# Patient Record
Sex: Male | Born: 1980 | Race: White | Hispanic: No | Marital: Married | Smoking: Never smoker
Health system: Southern US, Community
[De-identification: ages and names within clinical notes are randomized; demographics above are authoritative.]

## PROBLEM LIST (undated history)

## (undated) DIAGNOSIS — E079 Disorder of thyroid, unspecified: Secondary | ICD-10-CM

## (undated) HISTORY — PX: THYROIDECTOMY: SHX17

---

## 2016-03-28 DIAGNOSIS — M5416 Radiculopathy, lumbar region: Secondary | ICD-10-CM | POA: Insufficient documentation

## 2017-12-03 ENCOUNTER — Emergency Department: Payer: BC Managed Care – PPO

## 2017-12-03 ENCOUNTER — Other Ambulatory Visit: Payer: Self-pay

## 2017-12-03 ENCOUNTER — Encounter: Payer: Self-pay | Admitting: Emergency Medicine

## 2017-12-03 ENCOUNTER — Emergency Department
Admission: EM | Admit: 2017-12-03 | Discharge: 2017-12-03 | Disposition: A | Discharge status: Home / Self Care | Payer: BC Managed Care – PPO | Attending: Emergency Medicine | Admitting: Emergency Medicine

## 2017-12-03 DIAGNOSIS — M5126 Other intervertebral disc displacement, lumbar region: Secondary | ICD-10-CM | POA: Diagnosis not present

## 2017-12-03 DIAGNOSIS — M549 Dorsalgia, unspecified: Secondary | ICD-10-CM | POA: Diagnosis present

## 2017-12-03 DIAGNOSIS — M5127 Other intervertebral disc displacement, lumbosacral region: Secondary | ICD-10-CM

## 2017-12-03 DIAGNOSIS — M5416 Radiculopathy, lumbar region: Secondary | ICD-10-CM

## 2017-12-03 HISTORY — DX: Disorder of thyroid, unspecified: E07.9

## 2017-12-03 MED ORDER — METHYLPREDNISOLONE 4 MG PO TBPK: ORAL_TABLET | ORAL | 0 refills | Status: DC

## 2017-12-03 MED ORDER — METHOCARBAMOL 750 MG PO TABS: 750.0000 mg | ORAL_TABLET | Freq: Four times a day (QID) | ORAL | 0 refills | Status: AC

## 2017-12-03 MED ORDER — LIDOCAINE 5 % EX PTCH
1.0000 | MEDICATED_PATCH | CUTANEOUS | Status: DC
  Administered 2017-12-03: 1 via TRANSDERMAL
  Filled 2017-12-03 (×2): qty 1

## 2017-12-03 MED ORDER — HYDROMORPHONE HCL 1 MG/ML IJ SOLN
1.0000 mg | Freq: Once | INTRAMUSCULAR | Status: AC
  Administered 2017-12-03: 1 mg via INTRAMUSCULAR
  Filled 2017-12-03: qty 1

## 2017-12-03 MED ORDER — OXYCODONE-ACETAMINOPHEN 7.5-325 MG PO TABS: 1.0000 | ORAL_TABLET | Freq: Four times a day (QID) | ORAL | 0 refills | Status: DC | PRN

## 2017-12-03 MED ORDER — METHYLPREDNISOLONE SODIUM SUCC 125 MG IJ SOLR
80.0000 mg | Freq: Once | INTRAMUSCULAR | Status: AC
  Administered 2017-12-03: 80 mg via INTRAVENOUS
  Filled 2017-12-03: qty 2

## 2017-12-03 MED ORDER — HYDROMORPHONE HCL 1 MG/ML IJ SOLN
1.0000 mg | Freq: Once | INTRAMUSCULAR | Status: AC
  Administered 2017-12-03: 1 mg via INTRAVENOUS
  Filled 2017-12-03: qty 1

## 2017-12-03 NOTE — ED Triage Notes (Signed)
Here for lower back pain. Possible herniated disc . Saw ortho this AM and got flexeril but no help.  Working out and thinks hurt few days ago.  Only thing that has helped in past was injection.  No loss bowel or bladder.

## 2017-12-03 NOTE — ED Provider Notes (Addendum)
[facility] Emergency Department Provider Note   ____________________________________________   First MD Initiated Contact with Patient 12/03/17 1740     (approximate)  I have reviewed the triage vital signs and the nursing notes.   HISTORY  Chief Complaint Back Pain    HPI Joel Atkinson is a 37 y.o. male patient sent from orthopedic clinic secondary to acute back pain and x-ray finding of suspected herniated disc.  Patient stated back pain began 3 days ago after performing heavy lifting for physical training.  Patient denies bladder bowel dysfunction.  Patient has radicular component to the right lower extremity.  Past Medical History:  Diagnosis Date  . Thyroid disease     There are no active problems to display for this patient.   Past Surgical History:  Procedure Laterality Date  . THYROIDECTOMY      Prior to Admission medications   Medication Sig Start Date End Date Taking? Authorizing Provider  cyclobenzaprine (FLEXERIL) 10 MG tablet Take 10 mg by mouth 3 (three) times daily as needed for muscle spasms.   Yes [provider]    Allergies Patient has no known allergies.  History reviewed. No pertinent family history.  Social History Social History   Tobacco Use  . Smoking status: Never Smoker  . Smokeless tobacco: Never Used  Substance Use Topics  . Alcohol use: Never    Frequency: Never  . Drug use: Never    Review of Systems Constitutional: No fever/chills Eyes: No visual changes. ENT: No sore throat. Cardiovascular: Denies chest pain. Respiratory: Denies shortness of breath. Gastrointestinal: No abdominal pain.  No nausea, no vomiting.  No diarrhea.  No constipation. Genitourinary: Negative for dysuria. Musculoskeletal: Acute low back pain Skin: Negative for rash. Neurological: Negative for headaches, focal weakness or numbness.   ____________________________________________   PHYSICAL EXAM:  VITAL  SIGNS: ED Triage Vitals  Enc Vitals Group     BP 12/03/17 1705 137/62     Pulse Rate 12/03/17 1705 (!) 49     Resp 12/03/17 1705 20     Temp 12/03/17 1705 97.8 F (36.6 C)     Temp Source 12/03/17 1705 Oral     SpO2 12/03/17 1705 100 %     Weight 12/03/17 1704 205 lb (93 kg)     Height 12/03/17 1704 6\' 5"  (1.956 m)     Head Circumference --      Peak Flow --      Pain Score 12/03/17 1704 10     Pain Loc --      Pain Edu? --      Excl. in GC? --    Constitutional: Alert and oriented. Well appearing and in no acute distress. Eyes: Conjunctivae are normal. PERRL. EOMI. Head: Atraumatic. Nose: No congestion/rhinnorhea. Mouth/Throat: Mucous membranes are moist.  Oropharynx non-erythematous. Neck: No stridor.  Hematological/Lymphatic/Immunilogical: No cervical lymphadenopathy. Cardiovascular: Asymptomatic bradycardia, regular rhythm. Grossly normal heart sounds.  Good peripheral circulation. Respiratory: Normal respiratory effort.  No retractions. Lungs CTAB. Gastrointestinal: Soft and nontender. No distention. No abdominal bruits. No CVA tenderness. Musculoskeletal: No obvious spinal deformity.  Patient has decreased range of motion with extension.. Neurologic:  Normal speech and language. No gross focal neurologic deficits are appreciated. No gait instability. Skin:  Skin is warm, dry and intact. No rash noted. Psychiatric: Mood and affect are normal. Speech and behavior are normal.  ____________________________________________   LABS (all labs ordered are listed, but only abnormal results are displayed)  Labs Reviewed -  No data to display ____________________________________________  EKG   ____________________________________________  RADIOLOGY  ED MD interpretation:    Official radiology report(s): No results found.  ____________________________________________   PROCEDURES  Procedure(s) performed: None  Procedures  Critical Care performed:  No  ____________________________________________   INITIAL IMPRESSION / ASSESSMENT AND PLAN / ED COURSE  As part of my medical decision making, I reviewed the following data within the electronic MEDICAL RECORD NUMBER    Patient sent for orthopedic department of acute back pain with x-ray suggesting herniated disc at L5-S1.  Patient MRI was consistent with disc protrusions at L3-L4 and L5-S1.  Protrusions were contacting and displacing accident nerve roots at L3, L5-S1.  Discussed MRI findings with patient.  Patient given discharge care instructions.  Patient advised to contact neurology in the morning for an appointment.  Take medication as directed.     ____________________________________________   FINAL CLINICAL IMPRESSION(S) / ED DIAGNOSES  Final diagnoses:  None     ED Discharge Orders    None       Note:  This document was prepared using Dragon voice recognition software and may include unintentional dictation errors.    Joni ReiningSmith, Ronald K, PA-C 12/03/17 1933    Joni ReiningSmith, Ronald K, PA-C 12/03/17 1936    Minna AntisPaduchowski, Kevin, MD 12/04/17 1517

## 2017-12-03 NOTE — ED Notes (Signed)
See triage note  States he has a herniated disc   Developed increased pain while working out  [facility]  Was given flex[facility]  No relief  Also has his back adjusted   States pain has increased

## 2017-12-04 ENCOUNTER — Ambulatory Visit
Admission: RE | Admit: 2017-12-04 | Discharge: 2017-12-04 | Disposition: A | Discharge status: Home / Self Care | Payer: BC Managed Care – PPO | Source: Ambulatory Visit | Attending: Pain Medicine | Admitting: Pain Medicine

## 2017-12-04 ENCOUNTER — Encounter: Payer: Self-pay | Admitting: Pain Medicine

## 2017-12-04 ENCOUNTER — Ambulatory Visit (HOSPITAL_BASED_OUTPATIENT_CLINIC_OR_DEPARTMENT_OTHER): Payer: BC Managed Care – PPO | Admitting: Pain Medicine

## 2017-12-04 VITALS — BP 131/63 | HR 64 | Temp 98.3°F | Resp 18 | Ht 77.0 in | Wt 205.0 lb

## 2017-12-04 DIAGNOSIS — M5441 Lumbago with sciatica, right side: Secondary | ICD-10-CM | POA: Diagnosis not present

## 2017-12-04 DIAGNOSIS — E039 Hypothyroidism, unspecified: Secondary | ICD-10-CM | POA: Insufficient documentation

## 2017-12-04 DIAGNOSIS — M5417 Radiculopathy, lumbosacral region: Secondary | ICD-10-CM | POA: Insufficient documentation

## 2017-12-04 DIAGNOSIS — Z789 Other specified health status: Secondary | ICD-10-CM

## 2017-12-04 DIAGNOSIS — M48061 Spinal stenosis, lumbar region without neurogenic claudication: Secondary | ICD-10-CM | POA: Insufficient documentation

## 2017-12-04 DIAGNOSIS — M47816 Spondylosis without myelopathy or radiculopathy, lumbar region: Secondary | ICD-10-CM | POA: Insufficient documentation

## 2017-12-04 DIAGNOSIS — M5136 Other intervertebral disc degeneration, lumbar region: Secondary | ICD-10-CM

## 2017-12-04 DIAGNOSIS — M51379 Other intervertebral disc degeneration, lumbosacral region without mention of lumbar back pain or lower extremity pain: Secondary | ICD-10-CM | POA: Insufficient documentation

## 2017-12-04 DIAGNOSIS — Z7952 Long term (current) use of systemic steroids: Secondary | ICD-10-CM | POA: Insufficient documentation

## 2017-12-04 DIAGNOSIS — Z79899 Other long term (current) drug therapy: Secondary | ICD-10-CM

## 2017-12-04 DIAGNOSIS — Z79891 Long term (current) use of opiate analgesic: Secondary | ICD-10-CM | POA: Insufficient documentation

## 2017-12-04 DIAGNOSIS — M51369 Other intervertebral disc degeneration, lumbar region without mention of lumbar back pain or lower extremity pain: Secondary | ICD-10-CM | POA: Insufficient documentation

## 2017-12-04 DIAGNOSIS — M5116 Intervertebral disc disorders with radiculopathy, lumbar region: Secondary | ICD-10-CM | POA: Insufficient documentation

## 2017-12-04 DIAGNOSIS — F1190 Opioid use, unspecified, uncomplicated: Secondary | ICD-10-CM | POA: Diagnosis not present

## 2017-12-04 DIAGNOSIS — M5117 Intervertebral disc disorders with radiculopathy, lumbosacral region: Secondary | ICD-10-CM | POA: Diagnosis not present

## 2017-12-04 DIAGNOSIS — M9983 Other biomechanical lesions of lumbar region: Secondary | ICD-10-CM

## 2017-12-04 DIAGNOSIS — M5416 Radiculopathy, lumbar region: Secondary | ICD-10-CM | POA: Diagnosis not present

## 2017-12-04 DIAGNOSIS — M7918 Myalgia, other site: Secondary | ICD-10-CM | POA: Diagnosis not present

## 2017-12-04 DIAGNOSIS — N281 Cyst of kidney, acquired: Secondary | ICD-10-CM

## 2017-12-04 DIAGNOSIS — R937 Abnormal findings on diagnostic imaging of other parts of musculoskeletal system: Secondary | ICD-10-CM

## 2017-12-04 DIAGNOSIS — M4310 Spondylolisthesis, site unspecified: Secondary | ICD-10-CM

## 2017-12-04 DIAGNOSIS — M5137 Other intervertebral disc degeneration, lumbosacral region: Secondary | ICD-10-CM

## 2017-12-04 DIAGNOSIS — M79604 Pain in right leg: Secondary | ICD-10-CM | POA: Insufficient documentation

## 2017-12-04 DIAGNOSIS — M5126 Other intervertebral disc displacement, lumbar region: Secondary | ICD-10-CM | POA: Insufficient documentation

## 2017-12-04 DIAGNOSIS — M792 Neuralgia and neuritis, unspecified: Secondary | ICD-10-CM | POA: Insufficient documentation

## 2017-12-04 DIAGNOSIS — M899 Disorder of bone, unspecified: Secondary | ICD-10-CM

## 2017-12-04 DIAGNOSIS — M5127 Other intervertebral disc displacement, lumbosacral region: Secondary | ICD-10-CM

## 2017-12-04 MED ORDER — MIDAZOLAM HCL 5 MG/5ML IJ SOLN
1.0000 mg | INTRAMUSCULAR | Status: AC | PRN
  Administered 2017-12-04: 5 mg via INTRAVENOUS
  Filled 2017-12-04: qty 5

## 2017-12-04 MED ORDER — KETOROLAC TROMETHAMINE 60 MG/2ML IM SOLN
INTRAMUSCULAR | Status: AC
  Filled 2017-12-04: qty 2

## 2017-12-04 MED ORDER — IOPAMIDOL (ISOVUE-M 200) INJECTION 41%
10.0000 mL | Freq: Once | INTRAMUSCULAR | Status: AC
  Administered 2017-12-04: 10 mL via EPIDURAL
  Filled 2017-12-04: qty 10

## 2017-12-04 MED ORDER — SODIUM CHLORIDE 0.9% FLUSH
2.0000 mL | Freq: Once | INTRAVENOUS | Status: AC
  Administered 2017-12-04: 2 mL

## 2017-12-04 MED ORDER — LACTATED RINGERS IV SOLN: 1000.0000 mL | Freq: Once | INTRAVENOUS | Status: DC

## 2017-12-04 MED ORDER — FENTANYL CITRATE (PF) 100 MCG/2ML IJ SOLN
25.0000 ug | INTRAMUSCULAR | Status: AC | PRN
  Administered 2017-12-04: 100 ug via INTRAVENOUS
  Filled 2017-12-04: qty 2

## 2017-12-04 MED ORDER — KETOROLAC TROMETHAMINE 60 MG/2ML IM SOLN
60.0000 mg | Freq: Once | INTRAMUSCULAR | Status: AC
  Administered 2017-12-04: 60 mg via INTRAMUSCULAR

## 2017-12-04 MED ORDER — ORPHENADRINE CITRATE 30 MG/ML IJ SOLN
60.0000 mg | Freq: Once | INTRAMUSCULAR | Status: AC
  Administered 2017-12-04: 60 mg via INTRAMUSCULAR

## 2017-12-04 MED ORDER — ORPHENADRINE CITRATE 30 MG/ML IJ SOLN
INTRAMUSCULAR | Status: AC
  Filled 2017-12-04: qty 2

## 2017-12-04 MED ORDER — TRIAMCINOLONE ACETONIDE 40 MG/ML IJ SUSP
40.0000 mg | Freq: Once | INTRAMUSCULAR | Status: AC
  Administered 2017-12-04: 40 mg
  Filled 2017-12-04: qty 1

## 2017-12-04 MED ORDER — ROPIVACAINE HCL 2 MG/ML IJ SOLN
2.0000 mL | Freq: Once | INTRAMUSCULAR | Status: AC
  Administered 2017-12-04: 2 mL via EPIDURAL
  Filled 2017-12-04: qty 10

## 2017-12-04 MED ORDER — LIDOCAINE HCL 2 % IJ SOLN
20.0000 mL | Freq: Once | INTRAMUSCULAR | Status: AC
  Administered 2017-12-04: 400 mg
  Filled 2017-12-04: qty 100

## 2017-12-04 NOTE — Patient Instructions (Addendum)
____________________________________________________________________________________________  Post-Procedure Discharge Instructions  Instructions:  Apply ice: Fill a plastic sandwich bag with crushed ice. Cover it with a small towel and apply to injection site. Apply for 15 minutes then remove x 15 minutes. Repeat sequence on day of procedure, until you go to bed. The purpose is to minimize swelling and discomfort after procedure.  Apply heat: Apply heat to procedure site starting the day following the procedure. The purpose is to treat any soreness and discomfort from the procedure.  Food intake: Start with clear liquids (like water) and advance to regular food, as tolerated.   Physical activities: Keep activities to a minimum for the first 8 hours after the procedure.   Driving: If you have received any sedation, you are not allowed to drive for 24 hours after your procedure.  Blood thinner: Restart your blood thinner 6 hours after your procedure. (Only for those taking blood thinners)  Insulin: As soon as you can eat, you may resume your normal dosing schedule. (Only for those taking insulin)  Infection prevention: Keep procedure site clean and dry.  Post-procedure Pain Diary: Extremely important that this be done correctly and accurately. Recorded information will be used to determine the next step in treatment.  Pain evaluated is that of treated area only. Do not include pain from an untreated area.  Complete every hour, on the hour, for the initial 8 hours. Set an alarm to help you do this part accurately.  Do not go to sleep and have it completed later. It will not be accurate.  Follow-up appointment: Keep your follow-up appointment after the procedure. Usually 2 weeks for most procedures. (6 weeks in the case of radiofrequency.) Bring you pain diary.   Expect:  From numbing medicine (AKA: Local Anesthetics): Numbness or decrease in pain.  Onset: Full effect within 15  minutes of injected.  Duration: It will depend on the type of local anesthetic used. On the average, 1 to 8 hours.   From steroids: Decrease in swelling or inflammation. Once inflammation is improved, relief of the pain will follow.  Onset of benefits: Depends on the amount of swelling present. The more swelling, the longer it will take for the benefits to be seen. In some cases, up to 10 days.  Duration: Steroids will stay in the system x 2 weeks. Duration of benefits will depend on multiple posibilities including persistent irritating factors.  From procedure: Some discomfort is to be expected once the numbing medicine wears off. This should be minimal if ice and heat are applied as instructed.  Call if:  You experience numbness and weakness that gets worse with time, as opposed to wearing off.  New onset bowel or bladder incontinence. (This applies to Spinal procedures only)  Emergency Numbers:  Durning business hours (Monday - Thursday, 8:00 AM - 4:00 PM) (Friday, 9:00 AM - 12:00 Noon): [phone]  After hours: [phone] ____________________________________________________________________________________________   Epidural Steroid Injection Patient Information  Description: The epidural space surrounds the nerves as they exit the spinal cord.  In some patients, the nerves can be compressed and inflamed by a bulging disc or a tight spinal canal (spinal stenosis).  By injecting steroids into the epidural space, we can bring irritated nerves into direct contact with a potentially helpful medication.  These steroids act directly on the irritated nerves and can reduce swelling and inflammation which often leads to decreased pain.  Epidural steroids may be injected anywhere along the spine and from the neck to the   low back depending upon the location of your pain.   After numbing the skin with local anesthetic (like Novocaine), a small needle is passed into the epidural space  slowly.  You may experience a sensation of pressure while this is being done.  The entire block usually last less than 10 minutes.  Conditions which may be treated by epidural steroids:   Low back and leg pain  Neck and arm pain  Spinal stenosis  Post-laminectomy syndrome  Herpes zoster (shingles) pain  Pain from compression fractures  Preparation for the injection:  1. Do not eat any solid food or dairy products within 8 hours of your appointment.  2. You may drink clear liquids up to 3 hours before appointment.  Clear liquids include water, black coffee, juice or soda.  No milk or cream please. 3. You may take your regular medication, including pain medications, with a sip of water before your appointment  Diabetics should hold regular insulin (if taken separately) and take 1/2 normal NPH dos the morning of the procedure.  Carry some sugar containing items with you to your appointment. 4. A driver must accompany you and be prepared to drive you home after your procedure.  5. Bring all your current medications with your. 6. An IV may be inserted and sedation may be given at the discretion of the physician.   7. A blood pressure cuff, EKG and other monitors will often be applied during the procedure.  Some patients may need to have extra oxygen administered for a short period. 8. You will be asked to provide medical information, including your allergies, prior to the procedure.  We must know immediately if you are taking blood thinners (like Coumadin/Warfarin)  Or if you are allergic to IV iodine contrast (dye). We must know if you could possible be pregnant.  Possible side-effects:  Bleeding from needle site  Infection (rare, may require surgery)  Nerve injury (rare)  Numbness & tingling (temporary)  Difficulty urinating (rare, temporary)  Spinal headache ( a headache worse with upright posture)  Light -headedness (temporary)  Pain at injection site (several  days)  Decreased blood pressure (temporary)  Weakness in arm/leg (temporary)  Pressure sensation in back/neck (temporary)  Call if you experience:  Fever/chills associated with headache or increased back/neck pain.  Headache worsened by an upright position.  New onset weakness or numbness of an extremity below the injection site  Hives or difficulty breathing (go to the emergency room)  Inflammation or drainage at the infection site  Severe back/neck pain  Any new symptoms which are concerning to you  Please note:  Although the local anesthetic injected can often make your back or neck feel good for several hours after the injection, the pain will likely return.  It takes 3-7 days for steroids to work in the epidural space.  You may not notice any pain relief for at least that one week.  If effective, we will often do a series of three injections spaced 3-6 weeks apart to maximally decrease your pain.  After the initial series, we generally will wait several months before considering a repeat injection of the same type.  If you have any questions, please call [phone] [facility]  General Discharge Instructions :  If you need to reach your doctor call: Monday-Friday 8:00 am - 4:00 pm at [phone] or toll free [phone].  After clinic hours [phone] to have operator reach doctor.  Bring all   your medication bottles to all your appointments in the pain clinic.  To cancel or reschedule your appointment with Pain Management please remember to call 24 hours in advance to avoid a fee.  Refer to the educational materials which you have been given on: General Risks, I had my Procedure. Discharge Instructions, Post Sedation.  Post Procedure Instructions:  The drugs you were given will stay in your system until tomorrow, so for the next 24 hours you should not drive, make any legal decisions  or drink any alcoholic beverages.  You may eat anything you prefer, but it is better to start with liquids then soups and crackers, and gradually work up to solid foods.  Please notify your doctor immediately if you have any unusual bleeding, trouble breathing or pain that is not related to your normal pain.  Depending on the type of procedure that was done, some parts of your body may feel week and/or numb.  This usually clears up by tonight or the next day.  Walk with the use of an assistive device or accompanied by an adult for the 24 hours.  You may use ice on the affected area for the first 24 hours.  Put ice in a Ziploc bag and cover with a towel and place against area 15 minutes on 15 minutes off.  You may switch to heat after 24 hours.

## 2017-12-04 NOTE — Progress Notes (Signed)
Patient's Name: Joel Atkinson  MRN: [id number]  Referring Provider: Sable Feil, PA-C  DOB: 04-16-1981  PCP: Tora Duck, MD  DOS: 12/04/2017  Note by: Gaspar Cola, MD  Service setting: Ambulatory outpatient  Specialty: Interventional Pain Management  Location: [facility] (AMB) Pain Management Facility    Patient type: New patient ("FAST-TRACK" Evaluation)   Warning: This referral option does not include the extensive pharmacological evaluation required for [place] to take over the patient's medication management. The "Fast-Track" system is designed to bypass the new patient referral waiting list, as well as the normal patient evaluation process, in order to provide a patient in distress with a timely pain management intervention. Because the system was not designed to unfairly get a patient into our pain practice ahead of those already waiting, certain restrictions apply. By requesting a "Fast-Track" consult, the referring physician has opted to continue managing the patient's medications in order to get interventional urgent care.  Primary Reason for Visit: Interventional Pain Management Treatment. CC: Back Pain (down the right leg to the top of the foot.)  Procedure:       Anesthesia, Analgesia, Anxiolysis:  Type: Diagnostic Inter-Laminar Epidural Steroid Injection #1  Region: Lumbar Level: L5-S1 Level. Laterality: Right-Sided Paramedial  Type: Moderate (Conscious) Sedation combined with Local Anesthesia Indication(s): Analgesia and Anxiety Route: Intravenous (IV) IV Access: Secured Sedation: Meaningful verbal contact was maintained at all times during the procedure  Local Anesthetic: Lidocaine 1-2%   Indications: 1. Acute right lumbar radiculopathy   2. Lumbosacral radiculopathy at S1   3. Lumbosacral radiculopathy at L5   4. Lumbar radicular pain   5. Acute right-sided low back pain with right-sided sciatica   6. Right lumbar radiculitis   7. Acute pain of right lower extremity    8. DDD (degenerative disc disease), lumbar   9. DDD (degenerative disc disease), lumbosacral    Pain Score: Pre-procedure: 9 /10 Post-procedure: 3 /10  HPI  Mr. Mangel is a 37 y.o. year old, male patient, who comes today for a  "Fast-Track" new patient evaluation, as requested by Sable Feil, PA-C. The patient has been made aware that this type of referral option is reserved for the Interventional Pain Management portion of our practice and completely excludes the option of medication management. His primarily concern today is the Back Pain (down the right leg to the top of the foot.)  Pain Assessment: Location: Lower Back Onset: In the past 7 days Duration: Acute pain Quality: Constant, Burning, Numbness, Stabbing, Pressure, Shooting, Radiating Severity: 9 /10 (self-reported pain score)  Note: Reported level is compatible with observation.                               Effect on ADL: As of now, the patient isnt really able to walk. Timing: Constant Modifying factors: nothing  Onset and Duration: Date of onset: 12/01/2017 Cause of pain: exercising Severity: Getting worse, NAS-11 at its worse: 10/10, NAS-11 at its best: 8/10, NAS-11 now: 9/10 and NAS-11 on the average: 9/10 Timing: Not influenced by the time of the day, During activity or exercise and After activity or exercise Aggravating Factors: Bending, Climbing, Intercourse (sex), Kneeling, Lifiting, Motion, Prolonged sitting, Prolonged standing, Squatting, Stooping , Twisting, Walking, Walking uphill, Walking downhill and Working Alleviating Factors: Nerve blocks, Warm showers or baths and Chiropractic manipulations Associated Problems: Day-time cramps, Night-time cramps, Dizziness, Fatigue, Inability to concentrate, Nausea, Numbness, Personality changes, Sadness, Spasms, Tingling,  Weakness, Pain that wakes patient up and Pain that does not allow patient to sleep Quality of Pain: Aching, Agonizing, Annoying, Burning, Constant,  Cramping, Deep, Disabling, Distressing, Dreadful, Exhausting, Fearful, Feeling of constriction, Heavy, Horrible, Nagging, Pressure-like, Pulsating, Punishing, Sharp, Shooting, Sickening, Stabbing, Throbbing, Tingling, Tiring, Uncomfortable and Work related Previous Examinations or Tests: MRI scan, X-rays, Orthopedic evaluation and Chiropractic evaluation Previous Treatments: Chiropractic manipulations, Epidural steroid injections and Narcotic medications  The patient comes into the clinics today, referred to [place] for a lumbar epidural steroid injection.  Based on the patient's symptoms, history, and physical examination, the patient seems to be having a right-sided L5/S1 lumbosacral radiculopathy that seems to be fairly acute.  However, the patient does admit to a long-standing history of low back problems with a prior flareup of a similar radiculopathy treated at [facility] with a lumbar epidural steroid injection, which he describes as very effective.  This prior lumbar epidural steroid injection seems to have provided him with excellent relief of the pain for several years until recently when while he was exercising, apparently he reinjured his back.  An MRI of the lumbar spine done yesterday confirms that the patient has an intervertebral disc displacement at the L5-S1 compressing the right S1 nerve root.  Today the patient was unable to toe walk or heel walk.  He is also missing the Achilles tendon DTR for the right side.  The patient was informed of the risks and possible complications associated with the procedure including bleeding, infection, nerve damage, as well as an unforeseen event.  He understands, accepts, and wants to proceed with the treatment today.  Meds   Current Outpatient Medications:  .  levothyroxine (SYNTHROID, LEVOTHROID) 125 MCG tablet, Take 125 mcg by mouth daily before breakfast., Disp: , Rfl:  .  methocarbamol (ROBAXIN-750) 750 MG tablet, Take 1 tablet (750 mg total) by mouth 4 (four)  times daily., Disp: 20 tablet, Rfl: 0 .  methylPREDNISolone (MEDROL DOSEPAK) 4 MG TBPK tablet, Take Tapered dose as directed starting in the morning., Disp: 21 tablet, Rfl: 0 .  oxyCODONE-acetaminophen (PERCOCET) 7.5-325 MG tablet, Take 1 tablet by mouth every 6 (six) hours as needed for severe pain., Disp: 20 tablet, Rfl: 0  Current Facility-Administered Medications:  .  lactated ringers infusion 1,000 mL, 1,000 mL, Intravenous, Once, Milinda Pointer, MD  Imaging Review  Lumbosacral Imaging: Lumbar MR wo contrast:  Results for orders placed during the hospital encounter of 12/03/17  MR LUMBAR SPINE WO CONTRAST   Narrative CLINICAL DATA:  Initial evaluation for acute low back pain.  EXAM: MRI LUMBAR SPINE WITHOUT CONTRAST  TECHNIQUE: Multiplanar, multisequence MR imaging of the lumbar spine was performed. No intravenous contrast was administered.  COMPARISON:  None.  FINDINGS: Segmentation: Normal segmentation. Lowest well-formed disc labeled the L5-S1 level.  Alignment: Trace 3 mm retrolisthesis of L5 on S1. Vertebral bodies otherwise normally aligned with preservation of the normal lumbar lordosis.  Vertebrae: Vertebral body heights maintained without evidence for acute or chronic fracture. Bone marrow signal intensity within normal limits. No discrete or worrisome osseous lesions. No abnormal marrow edema.  Conus medullaris and cauda equina: Conus extends to the L1 level. Conus and cauda equina appear normal.  Paraspinal and other soft tissues: Paraspinous soft tissues within normal limits. Prominent parapelvic cysts noted within the right kidney. Visceral structures otherwise unremarkable.  Disc levels:  L1-2:  Unremarkable.  L2-3:  Unremarkable.  L3-4: Diffuse disc bulge with disc desiccation. There is a superimposed left foraminal/extraforaminal disc protrusion, contacting  and displacing the exiting left L3 nerve root (series 6, image 26). No significant  canal stenosis. Mild right with mild to moderate left L3 foraminal narrowing.  L4-5: Normal interspace. Mild to moderate facet hypertrophy. No significant canal or foraminal stenosis.  L5-S1: Chronic intervertebral disc space narrowing with diffuse disc bulge and disc desiccation. Superimposed right subarticular disc protrusion extends into the right lateral recess, impinging upon the descending right S1 nerve root (series 6, image 37). Mild facet and ligament flavum hypertrophy. Resultant moderate to severe right with moderate left lateral recess narrowing. Foramina remain patent.  IMPRESSION: 1. Left foraminal/extraforaminal disc protrusion at L3-4, contacting and displacing the exiting left L3 nerve root. 2. Shallow right subarticular disc protrusion at L5-S1, impinging upon the descending right S1 nerve root.   Electronically Signed   By: Jeannine Boga M.D.   On: 12/03/2017 19:16    Complexity Note: Imaging results reviewed. Results shared with Mr. Dowell, using Layman's terms.                         ROS  Cardiovascular: No reported cardiovascular signs or symptoms such as High blood pressure, coronary artery disease, abnormal heart rate or rhythm, heart attack, blood thinner therapy or heart weakness and/or failure Pulmonary or Respiratory: No reported pulmonary signs or symptoms such as wheezing and difficulty taking a deep full breath (Asthma), difficulty blowing air out (Emphysema), coughing up mucus (Bronchitis), persistent dry cough, or temporary stoppage of breathing during sleep Neurological: No reported neurological signs or symptoms such as seizures, abnormal skin sensations, urinary and/or fecal incontinence, being born with an abnormal open spine and/or a tethered spinal cord Review of Past Neurological Studies: No results found for this or any previous visit. Psychological-Psychiatric: No reported psychological or psychiatric signs or symptoms such as  difficulty sleeping, anxiety, depression, delusions or hallucinations (schizophrenial), mood swings (bipolar disorders) or suicidal ideations or attempts Gastrointestinal: No reported gastrointestinal signs or symptoms such as vomiting or evacuating blood, reflux, heartburn, alternating episodes of diarrhea and constipation, inflamed or scarred liver, or pancreas or irrregular and/or infrequent bowel movements Genitourinary: No reported renal or genitourinary signs or symptoms such as difficulty voiding or producing urine, peeing blood, non-functioning kidney, kidney stones, difficulty emptying the bladder, difficulty controlling the flow of urine, or chronic kidney disease Hematological: No reported hematological signs or symptoms such as prolonged bleeding, low or poor functioning platelets, bruising or bleeding easily, hereditary bleeding problems, low energy levels due to low hemoglobin or being anemic Endocrine: Slow thyroid Rheumatologic: No reported rheumatological signs and symptoms such as fatigue, joint pain, tenderness, swelling, redness, heat, stiffness, decreased range of motion, with or without associated rash Musculoskeletal: Negative for myasthenia gravis, muscular dystrophy, multiple sclerosis or malignant hyperthermia Work History: Working full time  Allergies  Mr. Bolger has No Known Allergies.  Laboratory Chemistry  Inflammation Markers (CRP: Acute Phase) (ESR: Chronic Phase) No results found.                        Rheumatology Markers No results found.                       Renal Function Markers No results found.                             Hepatic Function Markers No results found.  Electrolytes No results found.                       Neuropathy Markers No results found.                       Bone Pathology Markers No results found.                        Coagulation Parameters No results found.                       Cardiovascular  Markers No results found.                        CA Markers No results found.                       Note: No results found under the [place] record  [facility]  Drug: Mr. Dugo  reports that he does not use drugs. Alcohol:  reports that he does not drink alcohol. Tobacco:  reports that he has never smoked. He has never used smokeless tobacco. Medical:  has a past medical history of Thyroid disease. Family: family history is not on file.  Past Surgical History:  Procedure Laterality Date  . THYROIDECTOMY     Active Ambulatory Problems    Diagnosis Date Noted  . Lumbar radicular pain 03/28/2016  . Acute low back pain (Secondary Area of Pain) (Right) with sciatica (Right) 12/04/2017  . Lumbar radiculitis (S1) (Right) 12/04/2017  . Acute lumbar radiculopathy (S1) (Right) 12/04/2017  . Lumbosacral radiculopathy at S1 12/04/2017  . Lumbosacral radiculopathy at L5 12/04/2017  . Acute lower extremity pain (Primary Area of Pain) (Right) 12/04/2017  . DDD (degenerative disc disease), lumbar 12/04/2017  . DDD (degenerative disc disease), lumbosacral 12/04/2017  . Neurogenic pain of right lower extremity 12/04/2017  . Musculoskeletal pain 12/04/2017  . Disorder of skeletal system 12/04/2017  . Pharmacologic therapy 12/04/2017  . Problems influencing health status 12/04/2017  . Opiate use 12/04/2017  . Grade 1 Retrolisthesis of L5 oover S1 (3 mm) 12/04/2017  . Abnormal MRI, lumbar spine (12/03/2017) 12/04/2017  . Parapelvic renal cyst (Right) 12/04/2017  . Lumbar foraminal stenosis (Bilateral: L3-4) (L>R) 12/04/2017  . Lumbar lateral recess stenosis (Bilateral: L5-S1) (R>L) 12/04/2017  . Lumbar facet hypertrophy (L4-5 and L5-S1) 12/04/2017  . Lumbar disc protrusion (L3-4) (Left) 12/04/2017  . Lumbosacral intervertebral disc protrusion (L5-S1) (Right) 12/04/2017  . Lumbar nerve root impingement at L5-S1 (S1) (Right) 12/04/2017  . Adult hypothyroidism 12/04/2017    Resolved Ambulatory Problems    Diagnosis Date Noted  . No Resolved Ambulatory Problems   Past Medical History:  Diagnosis Date  . Thyroid disease    Constitutional Exam  General appearance: Well nourished, well developed, and well hydrated. In no apparent acute distress Vitals:   12/04/17 1602 12/04/17 1612 12/04/17 1622 12/04/17 1633  BP: 139/77 140/71 137/72 131/63  Pulse:      Resp: _0 Temp:      TempSrc:      SpO2: 98% 97% 98% 98%  Weight:      Height:       BMI Assessment: Estimated body mass index is 24.31 kg/m as calculated from the following:   Height as of this encounter: _1  (1.956 m).   Weight as of this  encounter: 205 lb (93 kg).  BMI interpretation table: BMI level Category Range association with higher incidence of chronic pain  <18 kg/m2 Underweight   18.5-24.9 kg/m2 Ideal body weight   25-29.9 kg/m2 Overweight Increased incidence by 20%  30-34.9 kg/m2 Obese (Class I) Increased incidence by 68%  35-39.9 kg/m2 Severe obesity (Class II) Increased incidence by 136%  >40 kg/m2 Extreme obesity (Class III) Increased incidence by 254%   BMI Readings from Last 4 Encounters:  12/04/17 24.31 kg/m  12/03/17 24.31 kg/m   Wt Readings from Last 4 Encounters:  12/04/17 205 lb (93 kg)  12/03/17 205 lb (93 kg)  Psych/Mental status: Alert, oriented x 3 (person, place, & time)       Eyes: PERLA Respiratory: No evidence of acute respiratory distress  Lumbar Spine Area Exam  Skin & Axial Inspection: No masses, redness, or swelling Alignment: Symmetrical Functional ROM: Unrestricted ROM      Stability: No instability detected Muscle Tone/Strength: Functionally intact. No obvious neuro-muscular anomalies detected. Sensory (Neurological): Unimpaired Palpation: No palpable anomalies       Provocative Tests: Lumbar Hyperextension and rotation test: evaluation deferred today       Lumbar Lateral bending test: evaluation deferred today       Patrick's  Maneuver: evaluation deferred today                    Gait & Posture Assessment  Ambulation: Limited Gait: Antalgic gait (limping) Posture: Difficulty standing up straight, due to pain   Lower Extremity Exam    Side: Right lower extremity  Side: Left lower extremity  Skin & Extremity Inspection: Skin color, temperature, and hair growth are WNL. No peripheral edema or cyanosis. No masses, redness, swelling, asymmetry, or associated skin lesions. No contractures.  Skin & Extremity Inspection: Skin color, temperature, and hair growth are WNL. No peripheral edema or cyanosis. No masses, redness, swelling, asymmetry, or associated skin lesions. No contractures.  Functional ROM: Guarding          Functional ROM: Unrestricted ROM          Muscle Tone/Strength: S1 weakness  Muscle Tone/Strength: Functionally intact. No obvious neuro-muscular anomalies detected.  Sensory (Neurological): Dermatomal pain pattern (S1)  Sensory (Neurological): Unimpaired  Palpation: No palpable anomalies  Palpation: No palpable anomalies  DTR: Absent Achilles tendon reflex (S1)  DTR: Present and within normal limits.   Procedure:       Anesthesia, Analgesia, Anxiolysis:  Type: Diagnostic Inter-Laminar Epidural Steroid Injection #1  Region: Lumbar Level: L5-S1 Level. Laterality: Right-Sided Paramedial  Type: Moderate (Conscious) Sedation combined with Local Anesthesia Indication(s): Analgesia and Anxiety Route: Intravenous (IV) IV Access: Secured Sedation: Meaningful verbal contact was maintained at all times during the procedure  Local Anesthetic: Lidocaine 1-2%   Indications: 1. Acute right lumbar radiculopathy   2. Lumbosacral radiculopathy at S1   3. Lumbosacral radiculopathy at L5   4. Lumbar radicular pain   5. Acute right-sided low back pain with right-sided sciatica   6. Right lumbar radiculitis   7. Acute pain of right lower extremity   8. DDD (degenerative disc disease), lumbar   9. DDD (degenerative  disc disease), lumbosacral    Pain Score: Pre-procedure: 9 /10 Post-procedure: 3 /10  Pre-op Assessment:  Mr. Schubach is a 37 y.o. (year old), male patient, seen today for interventional treatment. He  has a past surgical history that includes Thyroidectomy. Mr. Massi has a current medication list which includes the following prescription(s): levothyroxine, methocarbamol, methylprednisolone,  and oxycodone-acetaminophen, and the following Facility-Administered Medications: lactated ringers. His primarily concern today is the Back Pain (down the right leg to the top of the foot.)  Initial Vital Signs:  Pulse Rate: 64 Temp: 98.3 F (36.8 C) Resp: 18 BP: (!) 149/86 SpO2: 100 %  BMI: Estimated body mass index is 24.31 kg/m as calculated from the following:   Height as of this encounter: _0  (1.956 m).   Weight as of this encounter: 205 lb (93 kg).  Risk Assessment: Allergies: Reviewed. He has No Known Allergies.  Allergy Precautions: None required Coagulopathies: Reviewed. None identified.  Blood-thinner therapy: None at this time Active Infection(s): Reviewed. None identified. Mr. Deans is afebrile  Site Confirmation: Mr. Ericson was asked to confirm the procedure and laterality before marking the site Procedure checklist: Completed Consent: Before the procedure and under the influence of no sedative(s), amnesic(s), or anxiolytics, the patient was informed of the treatment options, risks and possible complications. To fulfill our ethical and legal obligations, as recommended by the American Medical Association's Code of Ethics, I have informed the patient of my clinical impression; the nature and purpose of the treatment or procedure; the risks, benefits, and possible complications of the intervention; the alternatives, including doing nothing; the risk(s) and benefit(s) of the alternative treatment(s) or procedure(s); and the risk(s) and benefit(s) of doing nothing. The patient was  provided information about the general risks and possible complications associated with the procedure. These may include, but are not limited to: failure to achieve desired goals, infection, bleeding, organ or nerve damage, allergic reactions, paralysis, and death. In addition, the patient was informed of those risks and complications associated to Spine-related procedures, such as failure to decrease pain; infection (i.e.: Meningitis, epidural or intraspinal abscess); bleeding (i.e.: epidural hematoma, subarachnoid hemorrhage, or any other type of intraspinal or peri-dural bleeding); organ or nerve damage (i.e.: Any type of peripheral nerve, nerve root, or spinal cord injury) with subsequent damage to sensory, motor, and/or autonomic systems, resulting in permanent pain, numbness, and/or weakness of one or several areas of the body; allergic reactions; (i.e.: anaphylactic reaction); and/or death. Furthermore, the patient was informed of those risks and complications associated with the medications. These include, but are not limited to: allergic reactions (i.e.: anaphylactic or anaphylactoid reaction(s)); adrenal axis suppression; blood sugar elevation that in diabetics may result in ketoacidosis or comma; water retention that in patients with history of congestive heart failure may result in shortness of breath, pulmonary edema, and decompensation with resultant heart failure; weight gain; swelling or edema; medication-induced neural toxicity; particulate matter embolism and blood vessel occlusion with resultant organ, and/or nervous system infarction; and/or aseptic necrosis of one or more joints. Finally, the patient was informed that Medicine is not an exact science; therefore, there is also the possibility of unforeseen or unpredictable risks and/or possible complications that may result in a catastrophic outcome. The patient indicated having understood very clearly. We have given the patient no guarantees  and we have made no promises. Enough time was given to the patient to ask questions, all of which were answered to the patient's satisfaction. Mr. Mclees has indicated that he wanted to continue with the procedure. Attestation: I, the ordering provider, attest that I have discussed with the patient the benefits, risks, side-effects, alternatives, likelihood of achieving goals, and potential problems during recovery for the procedure that I have provided informed consent. Date  Time: 12/04/2017  1:40 PM  Pre-Procedure Preparation:  Monitoring: As per clinic protocol. Respiration, ETCO2,  SpO2, BP, heart rate and rhythm monitor placed and checked for adequate function Safety Precautions: Patient was assessed for positional comfort and pressure points before starting the procedure. Time-out: I initiated and conducted the "Time-out" before starting the procedure, as per protocol. The patient was asked to participate by confirming the accuracy of the "Time Out" information. Verification of the correct person, site, and procedure were performed and confirmed by me, the nursing staff, and the patient. "Time-out" conducted as per Joint Commission's Universal Protocol (UP.01.01.01). Time: 1556  Description of Procedure:       Position: Prone with head of the table was raised to facilitate breathing. Target Area: The interlaminar space, initially targeting the lower laminar border of the superior vertebral body. Approach: Paramedial approach. Area Prepped: Entire Posterior Lumbar Region Prepping solution: ChloraPrep (2% chlorhexidine gluconate and 70% isopropyl alcohol) Safety Precautions: Aspiration looking for blood return was conducted prior to all injections. At no point did we inject any substances, as a needle was being advanced. No attempts were made at seeking any paresthesias. Safe injection practices and needle disposal techniques used. Medications properly checked for expiration dates. SDV (single dose  vial) medications used. Description of the Procedure: Protocol guidelines were followed. The procedure needle was introduced through the skin, ipsilateral to the reported pain, and advanced to the target area. Bone was contacted and the needle walked caudad, until the lamina was cleared. The epidural space was identified using "loss-of-resistance technique" with 2-3 ml of PF-NaCl (0.9% NSS), in a 5cc LOR glass syringe.  Vitals:   12/04/17 1602 12/04/17 1612 12/04/17 1622 12/04/17 1633  BP: 139/77 140/71 137/72 131/63  Pulse:      Resp: _0 Temp:      TempSrc:      SpO2: 98% 97% 98% 98%  Weight:      Height:        Start Time: 1556 hrs. End Time: 1602 hrs. Materials:  Needle(s) Type: Epidural needle Gauge: 17G Length: 3.5-in Medication(s): Please see orders for medications and dosing details.  Imaging Guidance (Spinal):  Type of Imaging Technique: Fluoroscopy Guidance (Spinal) Indication(s): Assistance in needle guidance and placement for procedures requiring needle placement in or near specific anatomical locations not easily accessible without such assistance. Exposure Time: Please see nurses notes. Contrast: Before injecting any contrast, we confirmed that the patient did not have an allergy to iodine, shellfish, or radiological contrast. Once satisfactory needle placement was completed at the desired level, radiological contrast was injected. Contrast injected under live fluoroscopy. No contrast complications. See chart for type and volume of contrast used. Fluoroscopic Guidance: I was personally present during the use of fluoroscopy. "Tunnel Vision Technique" used to obtain the best possible view of the target area. Parallax error corrected before commencing the procedure. "Direction-depth-direction" technique used to introduce the needle under continuous pulsed fluoroscopy. Once target was reached, antero-posterior, oblique, and lateral fluoroscopic projection used confirm  needle placement in all planes. Images permanently stored in EMR. Interpretation: I personally interpreted the imaging intraoperatively. Adequate needle placement confirmed in multiple planes. Appropriate spread of contrast into desired area was observed. No evidence of afferent or efferent intravascular uptake. No intrathecal or subarachnoid spread observed. Permanent images saved into the patient's record.  Antibiotic Prophylaxis:   Anti-infectives (From admission, onward)   None     Indication(s): None identified  Post-operative Assessment:  Post-procedure Vital Signs:  Pulse Rate: 64 Temp: 98.3 F (36.8 C) Resp: 18 BP: 131/63 SpO2: 98 %  EBL:  None  Complications: No immediate post-treatment complications observed by team, or reported by patient.  Note: The patient tolerated the entire procedure well. A repeat set of vitals were taken after the procedure and the patient was kept under observation following institutional policy, for this type of procedure. Post-procedural neurological assessment was performed, showing return to baseline, prior to discharge. The patient was provided with post-procedure discharge instructions, including a section on how to identify potential problems. Should any problems arise concerning this procedure, the patient was given instructions to immediately contact us, at any time, without hesitation. In any case, we plan to contact the patient by telephone for a follow-up status report regarding this interventional procedure.  Comments:  No additional relevant information.  Plan of Care   Imaging Orders     DG C-Arm 1-60 Min-No Report  Procedure Orders     Lumbar Epidural Injection  Medications ordered for procedure: Meds ordered this encounter  Medications  . orphenadrine (NORFLEX) injection 60 mg  . ketorolac (TORADOL) injection 60 mg  . iopamidol (ISOVUE-M) 41 % intrathecal injection 10 mL    Must be Myelogram-compatible. If not available,  you may substitute with a water-soluble, non-ionic, hypoallergenic, myelogram-compatible radiological contrast medium.  Marland Kitchen lidocaine (XYLOCAINE) 2 % (with pres) injection 400 mg  . sodium chloride flush (NS) 0.9 % injection 2 mL  . ropivacaine (PF) 2 mg/mL (0.2%) (NAROPIN) injection 2 mL  . triamcinolone acetonide (KENALOG-40) injection 40 mg  . midazolam (VERSED) 5 MG/5ML injection 1-2 mg    Make sure Flumazenil is available in the pyxis when using this medication. If oversedation occurs, administer 0.2 mg IV over 15 sec. If after 45 sec no response, administer 0.2 mg again over 1 min; may repeat at 1 min intervals; not to exceed 4 doses (1 mg)  . fentaNYL (SUBLIMAZE) injection 25-50 mcg    Make sure Narcan is available in the pyxis when using this medication. In the event of respiratory depression (RR< 8/min): Titrate NARCAN (naloxone) in increments of 0.1 to 0.2 mg IV at 2-3 minute intervals, until desired degree of reversal.  . lactated ringers infusion 1,000 mL   Medications administered: We administered orphenadrine, ketorolac, iopamidol, lidocaine, sodium chloride flush, ropivacaine (PF) 2 mg/mL (0.2%), triamcinolone acetonide, midazolam, and fentaNYL.  See the medical record for exact dosing, route, and time of administration.  New Prescriptions   No medications on file   Disposition: Discharge home  Discharge Date & Time: 12/04/2017; 1635 hrs.   Physician-requested Follow-up: Return for post-procedure eval (2 wks), w/ Dr. Dossie Arbour.  Future Appointments  Date Time Provider [place]  12/19/2017 11:15 AM Milinda Pointer, MD [facility] None   Primary Care Physician: Tora Duck, MD Location: [facility] Note by: Gaspar Cola, MD Date: 12/04/2017; Time: 5:32 PM  Disclaimer:  Medicine is not an exact science. The only guarantee in medicine is that nothing is guaranteed. It is important to note that the decision to proceed with this  intervention was based on the information collected from the patient. The Data and conclusions were drawn from the patient's questionnaire, the interview, and the physical examination. Because the information was provided in large part by the patient, it cannot be guaranteed that it has not been purposely or unconsciously manipulated. Every effort has been made to obtain as much relevant data as possible for this evaluation. It is important to note that the conclusions that lead to this procedure are derived in large part from the available  data. Always take into account that the treatment will also be dependent on availability of resources and existing treatment guidelines, considered by other Pain Management Practitioners as being common knowledge and practice, at the time of the intervention. For Medico-Legal purposes, it is also important to point out that variation in procedural techniques and pharmacological choices are the acceptable norm. The indications, contraindications, technique, and results of the above procedure should only be interpreted and judged by a Board-Certified Interventional Pain Specialist with extensive familiarity and expertise in the same exact procedure and technique.

## 2017-12-04 NOTE — Progress Notes (Signed)
Safety precautions to be maintained throughout the outpatient stay will include: orient to surroundings, keep bed in low position, maintain call bell within reach at all times, provide assistance with transfer out of bed and ambulation.  

## 2017-12-05 ENCOUNTER — Telehealth: Payer: Self-pay

## 2017-12-10 ENCOUNTER — Telehealth: Payer: Self-pay | Admitting: Pain Medicine

## 2017-12-10 NOTE — Telephone Encounter (Signed)
Patient lvmail asking Lupita LeashDonna to call him back he has a few more questions

## 2017-12-10 NOTE — Telephone Encounter (Signed)
Joel Atkinson returned patient call. Appointment changed.

## 2017-12-17 ENCOUNTER — Ambulatory Visit: Payer: BC Managed Care – PPO | Attending: Pain Medicine | Admitting: Pain Medicine

## 2017-12-17 ENCOUNTER — Encounter: Payer: Self-pay | Admitting: Pain Medicine

## 2017-12-17 ENCOUNTER — Other Ambulatory Visit: Payer: Self-pay

## 2017-12-17 VITALS — BP 138/89 | HR 59 | Temp 97.9°F | Resp 16 | Ht 77.0 in | Wt 205.0 lb

## 2017-12-17 DIAGNOSIS — M5137 Other intervertebral disc degeneration, lumbosacral region: Secondary | ICD-10-CM | POA: Diagnosis not present

## 2017-12-17 DIAGNOSIS — M5416 Radiculopathy, lumbar region: Secondary | ICD-10-CM

## 2017-12-17 DIAGNOSIS — M47816 Spondylosis without myelopathy or radiculopathy, lumbar region: Secondary | ICD-10-CM

## 2017-12-17 DIAGNOSIS — M4317 Spondylolisthesis, lumbosacral region: Secondary | ICD-10-CM | POA: Insufficient documentation

## 2017-12-17 DIAGNOSIS — M79604 Pain in right leg: Secondary | ICD-10-CM | POA: Diagnosis not present

## 2017-12-17 DIAGNOSIS — M5116 Intervertebral disc disorders with radiculopathy, lumbar region: Secondary | ICD-10-CM | POA: Insufficient documentation

## 2017-12-17 DIAGNOSIS — M48061 Spinal stenosis, lumbar region without neurogenic claudication: Secondary | ICD-10-CM | POA: Insufficient documentation

## 2017-12-17 DIAGNOSIS — G8929 Other chronic pain: Secondary | ICD-10-CM | POA: Diagnosis not present

## 2017-12-17 DIAGNOSIS — M5441 Lumbago with sciatica, right side: Secondary | ICD-10-CM

## 2017-12-17 DIAGNOSIS — E039 Hypothyroidism, unspecified: Secondary | ICD-10-CM | POA: Insufficient documentation

## 2017-12-17 DIAGNOSIS — M4310 Spondylolisthesis, site unspecified: Secondary | ICD-10-CM

## 2017-12-17 NOTE — Progress Notes (Addendum)
Patient's Name: Joel Atkinson  MRN: [id number]  Referring Provider: Hollace Kinnier, MD  DOB: November 19, 1980  PCP: Hollace Kinnier, MD  DOS: 12/17/2017  Note by: Oswaldo Done, MD  Service setting: Ambulatory outpatient  Specialty: Interventional Pain Management  Location: [facility] (AMB) Pain Management Facility    Patient type: Established "Fast Track"   Primary Reason(s) for Visit: Encounter for post-procedure evaluation of chronic illness with mild to moderate exacerbation CC: Leg Pain (back of right upper)  HPI  Joel Atkinson is a 37 y.o. year old, male patient, who comes today for a post-procedure evaluation. He has Lumbar radicular pain; Acute low back pain (Secondary Area of Pain) (Right) with sciatica (Right); Lumbar radiculitis (S1) (Right); Acute lumbar radiculopathy (S1) (Right); Lumbosacral radiculopathy at S1; Lumbosacral radiculopathy at L5; Acute lower extremity pain (Primary Area of Pain) (Right); DDD (degenerative disc disease), lumbar; DDD (degenerative disc disease), lumbosacral; Neurogenic pain of right lower extremity; Musculoskeletal pain; Disorder of skeletal system; Pharmacologic therapy; Problems influencing health status; Opiate use; Grade 1 Retrolisthesis of L5 oover S1 (3 mm); Abnormal MRI, lumbar spine (12/03/2017); Parapelvic renal cyst (Right); Lumbar foraminal stenosis (Bilateral: L3-4) (L>R); 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 His primarily concern today is the Leg Pain (back of right upper)  Pain Assessment: Location: Right, Upper Leg Radiating: pain and burning in right ankle area Onset: More than a month ago Duration: Chronic pain Quality: (aggravating) Severity: 1 /10 (self-reported pain score)  Note: Reported  level is compatible with observation.                         When using our objective Pain Scale, levels between 6 and 10/10 are said to belong in an emergency room, as it progressively worsens from a 6/10, described as severely limiting, requiring emergency care not usually available at an outpatient pain management facility. At a 6/10 level, communication becomes difficult and requires great effort. Assistance to reach the emergency department may be required. Facial flushing and profuse sweating along with potentially dangerous increases in heart rate and blood pressure will be evident. Timing: Constant Modifying factors: stretching  Joel Atkinson comes in today for post-procedure evaluation after the treatment done on 12/10/2017.  The patient returns to the clinic today after his lumbar epidural steroid injection indicating that he is doing "great".  He still having some discomfort and therefore he will like to proceed with treatment #2.  We will make arrangements for this as soon as possible.  Further details on both, my assessment(s), as well as the proposed treatment plan, please see below.  Post-Procedure Assessment  12/10/2017 Procedure: Diagnostic/therapeutic Right-sided L5-S1 interlaminar LESI #1 as a fast-track, under fluoroscopic guidance and IV sedation Pre-procedure pain score:  9/10 Post-procedure pain score: 3/10 (> 50% relief) Influential Factors: BMI: 24.31 kg/m Intra-procedural challenges: None observed.         Assessment challenges: None detected.              Reported side-effects: None.        Post-procedural adverse reactions or complications: None reported         Sedation: Sedation provided. When no sedatives are used, the analgesic levels obtained are directly associated to the effectiveness of the local anesthetics. However, when sedation is provided, the  level of analgesia obtained during the initial 1 hour following the intervention, is believed to be the result of a  combination of factors. These factors may include, but are not limited to: 1. The effectiveness of the local anesthetics used. 2. The effects of the analgesic(s) and/or anxiolytic(s) used. 3. The degree of discomfort experienced by the patient at the time of the procedure. 4. The patients ability and reliability in recalling and recording the events. 5. The presence and influence of possible secondary gains and/or psychosocial factors. Reported result: Relief experienced during the 1st hour after the procedure: 50 % (Ultra-Short Term Relief)            Interpretative annotation: Clinically possible results. Analgesia during this period is likely to be Local Anesthetic and/or IV Sedative (Analgesic/Anxiolytic) related.          Effects of local anesthetic: The analgesic effects attained during this period are directly associated to the localized infiltration of local anesthetics and therefore cary significant diagnostic value as to the etiological location, or anatomical origin, of the pain. Expected duration of relief is directly dependent on the pharmacodynamics of the local anesthetic used. Long-acting (4-6 hours) anesthetics used.  Reported result: Relief during the next 4 to 6 hour after the procedure: 50 % (Short-Term Relief)            Interpretative annotation: Clinically possible results. Analgesia during this period is likely to be Local Anesthetic-related.          Long-term benefit: Defined as the period of time past the expected duration of local anesthetics (1 hour for short-acting and 4-6 hours for long-acting). With the possible exception of prolonged sympathetic blockade from the local anesthetics, benefits during this period are typically attributed to, or associated with, other factors such as analgesic sensory neuropraxia, antiinflammatory effects, or beneficial biochemical changes provided by agents other than the local anesthetics.  Reported result: Extended relief following  procedure: 90 % (Long-Term Relief)            Interpretative annotation: Clinically appropriate result. Good relief. No permanent benefit expected. Inflammation plays a part in the etiology to the pain. Benefit believed to be steroid-related.  Current benefits: Defined as reported results that persistent at this point in time.   Analgesia: 75-100 % Joel Atkinson reports the extremity pain improved more than the axial pain. Function: Joel Atkinson reports improvement in function ROM: Joel Atkinson reports improvement in ROM Interpretative annotation: Ongoing benefit. Therapeutic success. Effective therapeutic approach.          Interpretation: Results would suggest a successful diagnostic intervention.                  Plan:  Proceed with treatment No.: 2          Recent Diagnostic Imaging Results  DG C-Arm 1-60 Min-No Report Fluoroscopy was utilized by the requesting physician.  No radiographic  interpretation.   Complexity Note: I personally reviewed the fluoroscopic imaging of the procedure.                        Meds   Current Outpatient Medications:  .  levothyroxine (SYNTHROID, LEVOTHROID) 125 MCG tablet, Take 125 mcg by mouth daily before breakfast., Disp: , Rfl:  .  methocarbamol (ROBAXIN-750) 750 MG tablet, Take 1 tablet (750 mg total) by mouth 4 (four) times daily., Disp: 20 tablet, Rfl: 0  ROS  Constitutional: Denies any fever or chills Gastrointestinal: No reported hemesis, hematochezia,  vomiting, or acute GI distress Musculoskeletal: Denies any acute onset joint swelling, redness, loss of ROM, or weakness Neurological: No reported episodes of acute onset apraxia, aphasia, dysarthria, agnosia, amnesia, paralysis, loss of coordination, or loss of consciousness  Allergies  Joel Atkinson has No Known Allergies.  PFSH  Drug: Joel Atkinson  reports that he does not use drugs. Alcohol:  reports that he does not drink alcohol. Tobacco:  reports that he has never smoked. He has never used  smokeless tobacco. Medical:  has a past medical history of Thyroid disease. Surgical: Joel Atkinson  has a past surgical history that includes Thyroidectomy. Family: family history is not on file.  Constitutional Exam  General appearance: Well nourished, well developed, and well hydrated. In no apparent acute distress Vitals:   12/17/17 1131  BP: 138/89  Pulse: (!) 59  Resp: 16  Temp: 97.9 F (36.6 C)  TempSrc: Oral  SpO2: 100%  Weight: 205 lb (93 kg)  Height: 6\' 5"  (1.956 m)   BMI Assessment: Estimated body mass index is 24.31 kg/m as calculated from the following:   Height as of this encounter: 6\' 5"  (1.956 m).   Weight as of this encounter: 205 lb (93 kg).  BMI interpretation table: BMI level Category Range association with higher incidence of chronic pain  <18 kg/m2 Underweight   18.5-24.9 kg/m2 Ideal body weight   25-29.9 kg/m2 Overweight Increased incidence by 20%  30-34.9 kg/m2 Obese (Class I) Increased incidence by 68%  35-39.9 kg/m2 Severe obesity (Class II) Increased incidence by 136%  >40 kg/m2 Extreme obesity (Class III) Increased incidence by 254%   BMI Readings from Last 4 Encounters:  12/17/17 24.31 kg/m  12/04/17 24.31 kg/m  12/03/17 24.31 kg/m   Wt Readings from Last 4 Encounters:  12/17/17 205 lb (93 kg)  12/04/17 205 lb (93 kg)  12/03/17 205 lb (93 kg)  Psych/Mental status: Alert, oriented x 3 (person, place, & time)       Eyes: PERLA Respiratory: No evidence of acute respiratory distress  Cervical Spine Area Exam  Skin & Axial Inspection: No masses, redness, edema, swelling, or associated skin lesions Alignment: Symmetrical Functional ROM: Unrestricted ROM      Stability: No instability detected Muscle Tone/Strength: Functionally intact. No obvious neuro-muscular anomalies detected. Sensory (Neurological): Unimpaired Palpation: No palpable anomalies              Upper Extremity (UE) Exam    Side: Right upper extremity  Side: Left upper  extremity  Skin & Extremity Inspection: Skin color, temperature, and hair growth are WNL. No peripheral edema or cyanosis. No masses, redness, swelling, asymmetry, or associated skin lesions. No contractures.  Skin & Extremity Inspection: Skin color, temperature, and hair growth are WNL. No peripheral edema or cyanosis. No masses, redness, swelling, asymmetry, or associated skin lesions. No contractures.  Functional ROM: Unrestricted ROM          Functional ROM: Unrestricted ROM          Muscle Tone/Strength: Functionally intact. No obvious neuro-muscular anomalies detected.  Muscle Tone/Strength: Functionally intact. No obvious neuro-muscular anomalies detected.  Sensory (Neurological): Unimpaired          Sensory (Neurological): Unimpaired          Palpation: No palpable anomalies              Palpation: No palpable anomalies              Specialized Test(s): Deferred  Specialized Test(s): Deferred          Thoracic Spine Area Exam  Skin & Axial Inspection: No masses, redness, or swelling Alignment: Symmetrical Functional ROM: Unrestricted ROM Stability: No instability detected Muscle Tone/Strength: Functionally intact. No obvious neuro-muscular anomalies detected. Sensory (Neurological): Unimpaired Muscle strength & Tone: No palpable anomalies  Lumbar Spine Area Exam  Skin & Axial Inspection: No masses, redness, or swelling Alignment: Symmetrical Functional ROM: Unrestricted ROM       Stability: No instability detected Muscle Tone/Strength: Functionally intact. No obvious neuro-muscular anomalies detected. Sensory (Neurological): Unimpaired Palpation: No palpable anomalies       Provocative Tests: Lumbar Hyperextension and rotation test: evaluation deferred today       Lumbar Lateral bending test: evaluation deferred today       Patrick's Maneuver: evaluation deferred today                    Gait & Posture Assessment  Ambulation: Unassisted Gait: Relatively normal for age  and body habitus Posture: WNL   Lower Extremity Exam    Side: Right lower extremity  Side: Left lower extremity  Skin & Extremity Inspection: Skin color, temperature, and hair growth are WNL. No peripheral edema or cyanosis. No masses, redness, swelling, asymmetry, or associated skin lesions. No contractures.  Skin & Extremity Inspection: Skin color, temperature, and hair growth are WNL. No peripheral edema or cyanosis. No masses, redness, swelling, asymmetry, or associated skin lesions. No contractures.  Functional ROM: Unrestricted ROM          Functional ROM: Unrestricted ROM          Muscle Tone/Strength: Functionally intact. No obvious neuro-muscular anomalies detected.  Muscle Tone/Strength: Functionally intact. No obvious neuro-muscular anomalies detected.  Sensory (Neurological): Unimpaired  Sensory (Neurological): Unimpaired  Palpation: No palpable anomalies  Palpation: No palpable anomalies   Assessment  Primary Diagnosis & Pertinent Problem List: The primary encounter diagnosis was Acute lower extremity pain (Primary Area of Pain) (Right). Diagnoses of Acute low back pain (Secondary Area of Pain) (Right) with sciatica (Right), DDD (degenerative disc disease), lumbosacral, Acute lumbar radiculopathy (S1) (Right), Grade 1 Retrolisthesis of L5 oover S1 (3 mm), and Lumbar facet hypertrophy (L4-5 and L5-S1) were also pertinent to this visit.  Status Diagnosis  Improved Improving Stable 1. Acute lower extremity pain (Primary Area of Pain) (Right)   2. Acute low back pain (Secondary Area of Pain) (Right) with sciatica (Right)   3. DDD (degenerative disc disease), lumbosacral   4. Acute lumbar radiculopathy (S1) (Right)   5. Grade 1 Retrolisthesis of L5 oover S1 (3 mm)   6. Lumbar facet hypertrophy (L4-5 and L5-S1)     Problems updated and reviewed during this visit: No problems updated. Plan of Care  Pharmacotherapy (Medications Ordered): No orders of the defined types were placed in  this encounter.  Medications administered today: Joel Atkinson had no medications administered during this visit.   Procedure Orders     Lumbar Epidural Injection Lab Orders  No laboratory test(s) ordered today   Imaging Orders  No imaging studies ordered today   Referral Orders  No referral(s) requested today    Interventional management options: Planned, scheduled, and/or pending:   Diagnostic/therapeutic right-sided L5-S1 interlaminar LESI #2    Considering:   Diagnostic/therapeutic right-sided L5-S1 interlaminar LESI #2    Palliative PRN treatment(s):   Palliative right-sided L5-S1 interlaminar LESI    Provider-requested follow-up: Return for Procedure (no sedation): (R) L5-S1 LESI #2.  Future Appointments  Date Time Provider Department Center  12/18/2017  8:15 AM Delano MetzNaveira, Jaysean [place], MD [facility] None   Primary Care Physician: Hollace KinnierKlein, Carol, MD [facility] Note by: Oswaldo DoneFrancisco A Hanalei Glace, MD Date: 12/17/2017; Time: 11:52 AM

## 2017-12-17 NOTE — Progress Notes (Signed)
Patient's Name: Joel Atkinson  MRN: [id number]  Referring Provider: Hollace Kinnier, MD  DOB: [birth year]  PCP: Hollace Kinnier, MD  DOS: 12/18/2017  Note by: Oswaldo Done, MD  Service setting: Ambulatory outpatient  Specialty: Interventional Pain Management  Patient type: Established  Location: [facility] (AMB) Pain Management Facility  Visit type: Interventional Procedure   Primary Reason for Visit: Interventional Pain Management Treatment. CC: Hip Pain (right)  Procedure:       Anesthesia, Analgesia, Anxiolysis:  Type: Therapeutic Inter-Laminar Epidural Steroid Injection #2  Region: Lumbar Level: L5-S1 Level. Laterality: Right-Sided Paramedial  Type: Local Anesthesia Indication(s): Analgesia         Route: Infiltration ([place]/IM) IV Access: Declined Sedation: Declined  Local Anesthetic: Lidocaine 1-2%   Indications: 1. DDD (degenerative disc disease), lumbosacral   2. Acute lumbar radiculopathy (S1) (Right)   3. Acute lower extremity pain (Primary Area of Pain) (Right)   4. 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 Aber is a 37 y.o. (year old), male patient, seen today for interventional treatment. He  has a past surgical history that includes Thyroidectomy. Mr. Duarte has a current medication list which includes the following prescription(s): levothyroxine and methocarbamol. His primarily concern today is the Hip Pain (right)  Initial Vital Signs:  Pulse/HCG Rate: (!) 57ECG Heart Rate: 63 Temp: 98 F (36.7 C) Resp: 16 BP: 134/80 SpO2: 100 %  BMI: Estimated body mass index is 24.31 kg/m as calculated from the following:   Height as of this encounter: 6\' 5"  (1.956 m).   Weight as of this encounter: 205 lb (93 kg).  Risk Assessment: Allergies: Reviewed. He has No Known Allergies.  Allergy Precautions: None  required Coagulopathies: Reviewed. None identified.  Blood-thinner therapy: None at this time Active Infection(s): Reviewed. None identified. Mr. Minahan is afebrile  Site Confirmation: Mr. Gaddie was asked to confirm the procedure and laterality before marking the site Procedure checklist: Completed Consent: Before the procedure and under the influence of no sedative(s), amnesic(s), or anxiolytics, the patient was informed of the treatment options, risks and possible complications. To fulfill our ethical and legal obligations, as recommended by the American Medical Association's Code of Ethics, I have informed the patient of my clinical impression; the nature and purpose of the treatment or procedure; the risks, benefits, and possible complications of the intervention; the alternatives, including doing nothing; the risk(s) and benefit(s) of the alternative treatment(s) or procedure(s); and the risk(s) and benefit(s) of doing nothing. The patient was provided information about the general risks and possible complications associated with the procedure. These may include, but are not limited to: failure to achieve desired goals, infection, bleeding, organ or nerve damage, allergic reactions, paralysis, and death. In addition, the patient was informed of those risks and complications associated to Spine-related procedures, such as failure to decrease pain; infection (i.e.: Meningitis, epidural or intraspinal abscess); bleeding (i.e.: epidural hematoma, subarachnoid hemorrhage, or any other type of intraspinal or peri-dural bleeding); organ or nerve damage (i.e.: Any type of peripheral nerve, nerve root, or spinal cord injury) with subsequent damage to sensory, motor, and/or autonomic systems, resulting in permanent pain, numbness, and/or weakness of one or several areas of the body; allergic reactions; (i.e.: anaphylactic reaction); and/or death. Furthermore, the patient was informed of those risks and  complications associated with the medications. These include, but are not  limited to: allergic reactions (i.e.: anaphylactic or anaphylactoid reaction(s)); adrenal axis suppression; blood sugar elevation that in diabetics may result in ketoacidosis or comma; water retention that in patients with history of congestive heart failure may result in shortness of breath, pulmonary edema, and decompensation with resultant heart failure; weight gain; swelling or edema; medication-induced neural toxicity; particulate matter embolism and blood vessel occlusion with resultant organ, and/or nervous system infarction; and/or aseptic necrosis of one or more joints. Finally, the patient was informed that Medicine is not an exact science; therefore, there is also the possibility of unforeseen or unpredictable risks and/or possible complications that may result in a catastrophic outcome. The patient indicated having understood very clearly. We have given the patient no guarantees and we have made no promises. Enough time was given to the patient to ask questions, all of which were answered to the patient's satisfaction. Mr. Sandie AnoLinch has indicated that he wanted to continue with the procedure. Attestation: I, the ordering provider, attest that I have discussed with the patient the benefits, risks, side-effects, alternatives, likelihood of achieving goals, and potential problems during recovery for the procedure that I have provided informed consent. Date  Time: 12/18/2017  8:15 AM  Pre-Procedure Preparation:  Monitoring: As per clinic protocol. Respiration, ETCO2, SpO2, BP, heart rate and rhythm monitor placed and checked for adequate function Safety Precautions: Patient was assessed for positional comfort and pressure points before starting the procedure. Time-out: I initiated and conducted the "Time-out" before starting the procedure, as per protocol. The patient was asked to participate by confirming the accuracy of the "Time  Out" information. Verification of the correct person, site, and procedure were performed and confirmed by me, the nursing staff, and the patient. "Time-out" conducted as per Joint Commission's Universal Protocol (UP.01.01.01). Time: [phone]  Description of Procedure:       Position: Prone with head of the table was raised to facilitate breathing. Target Area: The interlaminar space, initially targeting the lower laminar border of the superior vertebral body. Approach: Paramedial approach. Area Prepped: Entire Posterior Lumbar Region Prepping solution: ChloraPrep (2% chlorhexidine gluconate and 70% isopropyl alcohol) Safety Precautions: Aspiration looking for blood return was conducted prior to all injections. At no point did we inject any substances, as a needle was being advanced. No attempts were made at seeking any paresthesias. Safe injection practices and needle disposal techniques used. Medications properly checked for expiration dates. SDV (single dose vial) medications used. Description of the Procedure: Protocol guidelines were followed. The procedure needle was introduced through the skin, ipsilateral to the reported pain, and advanced to the target area. Bone was contacted and the needle walked caudad, until the lamina was cleared. The epidural space was identified using "loss-of-resistance technique" with 2-3 ml of PF-NaCl (0.9% NSS), in a 5cc LOR glass syringe. Vitals:   12/18/17 0815 12/18/17 0840 12/18/17 0844 12/18/17 0851  BP:  134/80 132/83 129/86  Pulse: (!) 57     Resp: 16 17 19 15   Temp: 98 F (36.7 C)     SpO2: 100% 98% 99% 99%  Weight: 205 lb (93 kg)     Height: 6\' 5"  (1.956 m)       Start Time: 0843 hrs. End Time: 0847 hrs. Materials:  Needle(s) Type: Epidural needle Gauge: 17G Length: 3.5-in Medication(s): Please see orders for medications and dosing details.  Imaging Guidance (Spinal):  Type of Imaging Technique: Fluoroscopy Guidance (Spinal) Indication(s):  Assistance in needle guidance and placement for procedures requiring needle placement in or near  specific anatomical locations not easily accessible without such assistance. Exposure Time: Please see nurses notes. Contrast: Before injecting any contrast, we confirmed that the patient did not have an allergy to iodine, shellfish, or radiological contrast. Once satisfactory needle placement was completed at the desired level, radiological contrast was injected. Contrast injected under live fluoroscopy. No contrast complications. See chart for type and volume of contrast used. Fluoroscopic Guidance: I was personally present during the use of fluoroscopy. "Tunnel Vision Technique" used to obtain the best possible view of the target area. Parallax error corrected before commencing the procedure. "Direction-depth-direction" technique used to introduce the needle under continuous pulsed fluoroscopy. Once target was reached, antero-posterior, oblique, and lateral fluoroscopic projection used confirm needle placement in all planes. Images permanently stored in EMR. Interpretation: I personally interpreted the imaging intraoperatively. Adequate needle placement confirmed in multiple planes. Appropriate spread of contrast into desired area was observed. No evidence of afferent or efferent intravascular uptake. No intrathecal or subarachnoid spread observed. Permanent images saved into the patient's record.  Antibiotic Prophylaxis:   Anti-infectives (From admission, onward)   None     Indication(s): None identified  Post-operative Assessment:  Post-procedure Vital Signs:  Pulse/HCG Rate: (!) 5774 Temp: 98 F (36.7 C) Resp: 15 BP: 129/86 SpO2: 99 %  EBL: None  Complications: No immediate post-treatment complications observed by team, or reported by patient.  Note: The patient tolerated the entire procedure well. A repeat set of vitals were taken after the procedure and the patient was kept under  observation following institutional policy, for this type of procedure. Post-procedural neurological assessment was performed, showing return to baseline, prior to discharge. The patient was provided with post-procedure discharge instructions, including a section on how to identify potential problems. Should any problems arise concerning this procedure, the patient was given instructions to immediately contact us, at any time, without hesitation. In any case, we plan to contact the patient by telephone for a follow-up status report regarding this interventional procedure.  Comments:  No additional relevant information.  Plan of Care    Imaging Orders     DG C-Arm 1-60 Min-No Report  Procedure Orders     Lumbar Epidural Injection  Medications ordered for procedure: Meds ordered this encounter  Medications  . lidocaine (XYLOCAINE) 2 % (with pres) injection 400 mg  . iopamidol (ISOVUE-M) 41 % intrathecal injection 10 mL    Must be Myelogram-compatible. If not available, you may substitute with a water-soluble, non-ionic, hypoallergenic, myelogram-compatible radiological contrast medium.  . sodium chloride flush (NS) 0.9 % injection 2 mL  . ropivacaine (PF) 2 mg/mL (0.2%) (NAROPIN) injection 2 mL  . triamcinolone acetonide (KENALOG-40) injection 40 mg   Medications administered: We administered lidocaine, iopamidol, sodium chloride flush, ropivacaine (PF) 2 mg/mL (0.2%), and triamcinolone acetonide.  See the medical record for exact dosing, route, and time of administration.  New Prescriptions   No medications on file   Disposition: Discharge home  Discharge Date & Time: 12/18/2017; 0853 hrs.   Physician-requested Follow-up: Return for post-procedure eval (2 wks), w/ Dr. Laban Emperor.  Future Appointments  Date Time Provider Department Center  01/07/2018 10:30 AM Delano Metz, MD [facility] None   Primary Care Physician: Hollace Kinnier, MD Location: [facility]  Facility Note by: Oswaldo Done, MD Date: 12/18/2017; Time: 9:46 AM  Disclaimer:  Medicine is not an Visual merchandiser. The only guarantee in medicine is that nothing is guaranteed. It is important to note that the decision to proceed with this  intervention was based on the information collected from the patient. The Data and conclusions were drawn from the patient's questionnaire, the interview, and the physical examination. Because the information was provided in large part by the patient, it cannot be guaranteed that it has not been purposely or unconsciously manipulated. Every effort has been made to obtain as much relevant data as possible for this evaluation. It is important to note that the conclusions that lead to this procedure are derived in large part from the available data. Always take into account that the treatment will also be dependent on availability of resources and existing treatment guidelines, considered by other Pain Management Practitioners as being common knowledge and practice, at the time of the intervention. For Medico-Legal purposes, it is also important to point out that variation in procedural techniques and pharmacological choices are the acceptable norm. The indications, contraindications, technique, and results of the above procedure should only be interpreted and judged by a Board-Certified Interventional Pain Specialist with extensive familiarity and expertise in the same exact procedure and technique.

## 2017-12-17 NOTE — Patient Instructions (Signed)
____________________________________________________________________________________________  Preparing for your procedure (without sedation)  Instructions: . Oral Intake: Do not eat or drink anything for at least 3 hours prior to your procedure. . Transportation: Unless otherwise stated by your physician, you may drive yourself after the procedure. . Blood Pressure Medicine: Take your blood pressure medicine with a sip of water the morning of the procedure. . Blood thinners:  . Diabetics on insulin: Notify the staff so that you can be scheduled 1st case in the morning. If your diabetes requires high dose insulin, take only  of your normal insulin dose the morning of the procedure and notify the staff that you have done so. . Preventing infections: Shower with an antibacterial soap the morning of your procedure.  . Build-up your immune system: Take 1000 mg of Vitamin C with every meal (3 times a day) the day prior to your procedure. . Antibiotics: Inform the staff if you have a condition or reason that requires you to take antibiotics before dental procedures. . Pregnancy: If you are pregnant, call and cancel the procedure. . Sickness: If you have a cold, fever, or any active infections, call and cancel the procedure. . Arrival: You must be in the facility at least 30 minutes prior to your scheduled procedure. . Children: Do not bring any children with you. . Dress appropriately: Bring dark clothing that you would not mind if they get stained. . Valuables: Do not bring any jewelry or valuables.  Procedure appointments are reserved for interventional treatments only. . No Prescription Refills. . No medication changes will be discussed during procedure appointments. . No disability issues will be discussed.  Remember:  Regular Business hours are:  Monday to Thursday 8:00 AM to 4:00 PM  Provider's Schedule: Javana Schey, MD:  Procedure days: Tuesday and Thursday 7:30 AM to 4:00  PM  Bilal Lateef, MD:  Procedure days: Monday and Wednesday 7:30 AM to 4:00 PM ____________________________________________________________________________________________    

## 2017-12-17 NOTE — Progress Notes (Signed)
Safety precautions to be maintained throughout the outpatient stay will include: orient to surroundings, keep bed in low position, maintain call bell within reach at all times, provide assistance with transfer out of bed and ambulation.  

## 2017-12-18 ENCOUNTER — Other Ambulatory Visit: Payer: Self-pay

## 2017-12-18 ENCOUNTER — Ambulatory Visit
Admission: RE | Admit: 2017-12-18 | Discharge: 2017-12-18 | Disposition: A | Discharge status: Home / Self Care | Payer: BC Managed Care – PPO | Source: Ambulatory Visit | Attending: Pain Medicine | Admitting: Pain Medicine

## 2017-12-18 ENCOUNTER — Ambulatory Visit (HOSPITAL_BASED_OUTPATIENT_CLINIC_OR_DEPARTMENT_OTHER): Payer: BC Managed Care – PPO | Admitting: Pain Medicine

## 2017-12-18 ENCOUNTER — Encounter: Payer: Self-pay | Admitting: Pain Medicine

## 2017-12-18 VITALS — BP 129/86 | HR 57 | Temp 98.0°F | Resp 15 | Ht 77.0 in | Wt 205.0 lb

## 2017-12-18 DIAGNOSIS — M79604 Pain in right leg: Secondary | ICD-10-CM

## 2017-12-18 DIAGNOSIS — M5117 Intervertebral disc disorders with radiculopathy, lumbosacral region: Secondary | ICD-10-CM | POA: Insufficient documentation

## 2017-12-18 DIAGNOSIS — M5416 Radiculopathy, lumbar region: Secondary | ICD-10-CM

## 2017-12-18 DIAGNOSIS — M48061 Spinal stenosis, lumbar region without neurogenic claudication: Secondary | ICD-10-CM | POA: Diagnosis not present

## 2017-12-18 DIAGNOSIS — M25551 Pain in right hip: Secondary | ICD-10-CM | POA: Insufficient documentation

## 2017-12-18 DIAGNOSIS — M5137 Other intervertebral disc degeneration, lumbosacral region: Secondary | ICD-10-CM

## 2017-12-18 MED ORDER — SODIUM CHLORIDE 0.9% FLUSH
2.0000 mL | Freq: Once | INTRAVENOUS | Status: AC
  Administered 2017-12-18: 10 mL

## 2017-12-18 MED ORDER — IOPAMIDOL (ISOVUE-M 200) INJECTION 41%
INTRAMUSCULAR | Status: AC
  Filled 2017-12-18: qty 10

## 2017-12-18 MED ORDER — ROPIVACAINE HCL 2 MG/ML IJ SOLN
2.0000 mL | Freq: Once | INTRAMUSCULAR | Status: AC
  Administered 2017-12-18: 10 mL via EPIDURAL

## 2017-12-18 MED ORDER — LIDOCAINE HCL 2 % IJ SOLN
20.0000 mL | Freq: Once | INTRAMUSCULAR | Status: AC
  Administered 2017-12-18: 400 mg

## 2017-12-18 MED ORDER — ROPIVACAINE HCL 2 MG/ML IJ SOLN
INTRAMUSCULAR | Status: AC
  Filled 2017-12-18: qty 10

## 2017-12-18 MED ORDER — TRIAMCINOLONE ACETONIDE 40 MG/ML IJ SUSP
40.0000 mg | Freq: Once | INTRAMUSCULAR | Status: AC
  Administered 2017-12-18: 40 mg

## 2017-12-18 MED ORDER — LIDOCAINE HCL 2 % IJ SOLN
INTRAMUSCULAR | Status: AC
  Filled 2017-12-18: qty 20

## 2017-12-18 MED ORDER — TRIAMCINOLONE ACETONIDE 40 MG/ML IJ SUSP
INTRAMUSCULAR | Status: AC
  Filled 2017-12-18: qty 1

## 2017-12-18 MED ORDER — SODIUM CHLORIDE 0.9 % IJ SOLN
INTRAMUSCULAR | Status: AC
  Filled 2017-12-18: qty 10

## 2017-12-18 MED ORDER — IOPAMIDOL (ISOVUE-M 200) INJECTION 41%
10.0000 mL | Freq: Once | INTRAMUSCULAR | Status: AC
  Administered 2017-12-18: 10 mL via EPIDURAL

## 2017-12-18 NOTE — Patient Instructions (Addendum)

## 2017-12-19 ENCOUNTER — Telehealth: Payer: Self-pay

## 2017-12-19 ENCOUNTER — Ambulatory Visit: Payer: BC Managed Care – PPO | Admitting: Pain Medicine

## 2017-12-19 NOTE — Telephone Encounter (Signed)
Post procedure phone call.  Left message.  

## 2018-01-07 ENCOUNTER — Ambulatory Visit: Payer: BC Managed Care – PPO | Admitting: Pain Medicine

## 2018-01-07 NOTE — Progress Notes (Deleted)
Patient's Name: Joel Atkinson  MRN: [id number]  Referring Provider: Hollace Kinnier, MD  DOB: 1980/11/14  PCP: Hollace Kinnier, MD  DOS: 01/07/2018  Note by: Oswaldo Done, MD  Service setting: Ambulatory outpatient  Specialty: Interventional Pain Management  Location: [facility] (AMB) Pain Management Facility    Patient type: Established   Primary Reason(s) for Visit: Encounter for post-procedure evaluation of chronic illness with mild to moderate exacerbation CC: No chief complaint on file.  HPI  [name] is a 37 y.o. year old, male patient, who comes today for a post-procedure evaluation. He has Lumbar radicular pain; Acute low back pain (Secondary Area of Pain) (Right) with sciatica (Right); Lumbar radiculitis (S1) (Right); Acute lumbar radiculopathy (S1) (Right); Lumbosacral radiculopathy at S1; Lumbosacral radiculopathy at L5; Acute lower extremity pain (Primary Area of Pain) (Right); DDD (degenerative disc disease), lumbar; DDD (degenerative disc disease), lumbosacral; Neurogenic pain of right lower extremity; Musculoskeletal pain; Disorder of skeletal system; Pharmacologic therapy; Problems influencing health status; Opiate use; Grade 1 Retrolisthesis of L5 oover S1 (3 mm); Abnormal MRI, lumbar spine (12/03/2017); Parapelvic renal cyst (Right); Lumbar foraminal stenosis (Bilateral: L3-4) (L>R); 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 His primarily concern today is the No chief complaint on file.  Pain Assessment: Location:     Radiating:   Onset:   Duration:   Quality:   Severity:  /10 (subjective, self-reported pain score)  Note: Reported level is compatible with observation.                         When using our objective Pain Scale,  levels between 6 and 10/10 are said to belong in an emergency room, as it progressively worsens from a 6/10, described as severely limiting, requiring emergency care not usually available at an outpatient pain management facility. At a 6/10 level, communication becomes difficult and requires great effort. Assistance to reach the emergency department may be required. Facial flushing and profuse sweating along with potentially dangerous increases in heart rate and blood pressure will be evident. Effect on ADL:   Timing:   Modifying factors:   BP:    HR:    [name] comes in today for post-procedure evaluation after the treatment done on 12/18/2017.  Further details on both, my assessment(s), as well as the proposed treatment plan, please see below.  Post-Procedure Assessment  12/18/2017 Procedure: Diagnostic/therapeutic right-sided L5-S1 interlaminar LESI #2  Pre-procedure pain score:  1/10 Post-procedure pain score: 0/10 (100% relief) Influential Factors: BMI:   Intra-procedural challenges: None observed.         Assessment challenges: None detected.              Reported side-effects: None.        Post-procedural adverse reactions or complications: None reported         Sedation: No sedation used. When no sedatives are used, the analgesic levels obtained are directly associated to the effectiveness of the local anesthetics. However, when sedation is provided, the level of analgesia obtained during the initial 1 hour following the intervention, is believed to be the result of a combination of factors. These factors may include, but are not limited to: 1. The effectiveness of the local anesthetics used. 2. The effects of the analgesic(s) and/or anxiolytic(s) used. 3.  The degree of discomfort experienced by the patient at the time of the procedure. 4. The patients ability and reliability in recalling and recording the events. 5. The presence and influence of possible secondary gains and/or  psychosocial factors. Reported result: Relief experienced during the 1st hour after the procedure:   (Ultra-Short Term Relief)            Interpretative annotation: Clinically appropriate result. No IV Analgesic or Anxiolytic given, therefore benefits are completely due to Local Anesthetic effects.          Effects of local anesthetic: The analgesic effects attained during this period are directly associated to the localized infiltration of local anesthetics and therefore cary significant diagnostic value as to the etiological location, or anatomical origin, of the pain. Expected duration of relief is directly dependent on the pharmacodynamics of the local anesthetic used. Long-acting (4-6 hours) anesthetics used.  Reported result: Relief during the next 4 to 6 hour after the procedure:   (Short-Term Relief)            Interpretative annotation: Clinically appropriate result. Analgesia during this period is likely to be Local Anesthetic-related.          Long-term benefit: Defined as the period of time past the expected duration of local anesthetics (1 hour for short-acting and 4-6 hours for long-acting). With the possible exception of prolonged sympathetic blockade from the local anesthetics, benefits during this period are typically attributed to, or associated with, other factors such as analgesic sensory neuropraxia, antiinflammatory effects, or beneficial biochemical changes provided by agents other than the local anesthetics.  Reported result: Extended relief following procedure:   (Long-Term Relief)            Interpretative annotation: Clinically appropriate result. Good relief. No permanent benefit expected. Inflammation plays a part in the etiology to the pain.          Current benefits: Defined as reported results that persistent at this point in time.   Analgesia: *** %            Function: Somewhat improved ROM: Somewhat improved Interpretative annotation: Recurrence of symptoms. No  permanent benefit expected. Effective diagnostic intervention.          Interpretation: Results would suggest a successful diagnostic intervention.                  Plan:  Please see "Plan of Care" for details.                Laboratory Chemistry   Note: No results found under the [facility] electronic medical record  Recent Diagnostic Imaging Results  DG C-Arm 1-60 Min-No Report Fluoroscopy was utilized by the requesting physician.  No radiographic  interpretation.   Complexity Note: I personally reviewed the fluoroscopic imaging of the procedure.                        Meds   Current Outpatient Medications:  .  levothyroxine (SYNTHROID, LEVOTHROID) 125 MCG tablet, Take 125 mcg by mouth daily before breakfast., Disp: , Rfl:  .  methocarbamol (ROBAXIN-750) 750 MG tablet, Take 1 tablet (750 mg total) by mouth 4 (four) times daily., Disp: 20 tablet, Rfl: 0  ROS  Constitutional: Denies any fever or chills Gastrointestinal: No reported hemesis, hematochezia, vomiting, or acute GI distress Musculoskeletal: Denies any acute onset joint swelling, redness, loss of ROM, or weakness Neurological: No reported episodes of acute onset apraxia, aphasia, dysarthria, agnosia,  amnesia, paralysis, loss of coordination, or loss of consciousness  Allergies  Mr. Diekman has No Known Allergies.  PFSH  Drug: Mr. Fabiano  reports that he does not use drugs. Alcohol:  reports that he does not drink alcohol. Tobacco:  reports that he has never smoked. He has never used smokeless tobacco. Medical:  has a past medical history of Thyroid disease. Surgical: Mr. Maykel  has a past surgical history that includes Thyroidectomy. Family: family history is not on file.  Constitutional Exam  General appearance: Well nourished, well developed, and well hydrated. In no apparent acute distress There were no vitals filed for this visit. BMI Assessment: Estimated body mass index is 24.31 kg/m as calculated from  the following:   Height as of 12/18/17:  (1.956 m).   Weight as of 12/18/17: 205 lb (93 kg).  BMI interpretation table: BMI level Category Range association with higher incidence of chronic pain  <18 kg/m2 Underweight   18.5-24.9 kg/m2 Ideal body weight   25-29.9 kg/m2 Overweight Increased incidence by 20%  30-34.9 kg/m2 Obese (Class I) Increased incidence by 68%  35-39.9 kg/m2 Severe obesity (Class II) Increased incidence by 136%  >40 kg/m2 Extreme obesity (Class III) Increased incidence by 254%   Patient's current BMI Ideal Body weight  There is no height or weight on file to calculate BMI. Patient weight not recorded   BMI Readings from Last 4 Encounters:  12/18/17 24.31 kg/m  12/17/17 24.31 kg/m  12/04/17 24.31 kg/m  12/03/17 24.31 kg/m   Wt Readings from Last 4 Encounters:  12/18/17 205 lb (93 kg)  12/17/17 205 lb (93 kg)  12/04/17 205 lb (93 kg)  12/03/17 205 lb (93 kg)  Psych/Mental status: Alert, oriented x 3 (person, place, & time)       Eyes: PERLA Respiratory: No evidence of acute respiratory distress  Cervical Spine Area Exam  Skin & Axial Inspection: No masses, redness, edema, swelling, or associated skin lesions Alignment: Symmetrical Functional ROM: Unrestricted ROM      Stability: No instability detected Muscle Tone/Strength: Functionally intact. No obvious neuro-muscular anomalies detected. Sensory (Neurological): Unimpaired Palpation: No palpable anomalies              Upper Extremity (UE) Exam    Side: Right upper extremity  Side: Left upper extremity  Skin & Extremity Inspection: Skin color, temperature, and hair growth are WNL. No peripheral edema or cyanosis. No masses, redness, swelling, asymmetry, or associated skin lesions. No contractures.  Skin & Extremity Inspection: Skin color, temperature, and hair growth are WNL. No peripheral edema or cyanosis. No masses, redness, swelling, asymmetry, or associated skin lesions. No contractures.   Functional ROM: Unrestricted ROM          Functional ROM: Unrestricted ROM          Muscle Tone/Strength: Functionally intact. No obvious neuro-muscular anomalies detected.  Muscle Tone/Strength: Functionally intact. No obvious neuro-muscular anomalies detected.  Sensory (Neurological): Unimpaired          Sensory (Neurological): Unimpaired          Palpation: No palpable anomalies              Palpation: No palpable anomalies              Specialized Test(s): Deferred         Specialized Test(s): Deferred          Thoracic Spine Area Exam  Skin & Axial Inspection: No masses, redness, or swelling Alignment: Symmetrical  Functional ROM: Unrestricted ROM Stability: No instability detected Muscle Tone/Strength: Functionally intact. No obvious neuro-muscular anomalies detected. Sensory (Neurological): Unimpaired Muscle strength & Tone: No palpable anomalies  Lumbar Spine Area Exam  Skin & Axial Inspection: No masses, redness, or swelling Alignment: Symmetrical Functional ROM: Unrestricted ROM       Stability: No instability detected Muscle Tone/Strength: Functionally intact. No obvious neuro-muscular anomalies detected. Sensory (Neurological): Unimpaired Palpation: No palpable anomalies       Provocative Tests: Lumbar Hyperextension and rotation test: evaluation deferred today       Lumbar Lateral bending test: evaluation deferred today       Patrick's Maneuver: evaluation deferred today                    Gait & Posture Assessment  Ambulation: Unassisted Gait: Relatively normal for age and body habitus Posture: WNL   Lower Extremity Exam    Side: Right lower extremity  Side: Left lower extremity  Stability: No instability observed          Stability: No instability observed          Skin & Extremity Inspection: Skin color, temperature, and hair growth are WNL. No peripheral edema or cyanosis. No masses, redness, swelling, asymmetry, or associated skin lesions. No contractures.  Skin  & Extremity Inspection: Skin color, temperature, and hair growth are WNL. No peripheral edema or cyanosis. No masses, redness, swelling, asymmetry, or associated skin lesions. No contractures.  Functional ROM: Unrestricted ROM                  Functional ROM: Unrestricted ROM                  Muscle Tone/Strength: Functionally intact. No obvious neuro-muscular anomalies detected.  Muscle Tone/Strength: Functionally intact. No obvious neuro-muscular anomalies detected.  Sensory (Neurological): Unimpaired  Sensory (Neurological): Unimpaired  Palpation: No palpable anomalies  Palpation: No palpable anomalies   Assessment  Primary Diagnosis & Pertinent Problem List: The primary encounter diagnosis was Acute lower extremity pain (Primary Area of Pain) (Right). 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  Status Diagnosis  Controlled Controlled Controlled 1. Acute lower extremity pain (Primary Area of Pain) (Right)   2. Acute low back pain (Secondary Area of Pain) (Right) with sciatica (Right)   3. 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 Lumbar radicular pain   5. 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 Plan of Care  Pharmacotherapy (Medications Ordered): No orders of the defined types were placed in this encounter.  Medications administered today: Jsiah Menta. Walen had no medications administered during this visit.  Procedure Orders    No procedure(s) ordered today   Lab Orders  No laboratory test(s) ordered today   Imaging Orders  No imaging studies ordered today   Referral Orders  No referral(s) requested today    Interventional management options: Planned,  scheduled, and/or pending:   ***   Considering:   Palliative right-sided L5-S1 interlaminar LESI    Palliative PRN treatment(s):   Palliative right-sided L5-S1 interlaminar LESI    Provider-requested follow-up: No follow-ups on file.  Future Appointments  Date Time Provider Department Center  01/07/2018 10:30 AM Delano Metz,  MD [facility] None   Primary Care Physician: Hollace Kinnier, MD Location: [facility] Note by: Oswaldo Done, MD Date: 01/07/2018; Time: 7:31 AM

## 2019-09-02 IMAGING — MR MR LUMBAR SPINE W/O CM
5 series · 34 of 48 positions shown · non-contrast
Comparison: None.

CLINICAL DATA: Initial evaluation for acute low back pain.

EXAM:
MRI LUMBAR SPINE WITHOUT CONTRAST
TECHNIQUE: Multiplanar, multisequence MR imaging of the lumbar spine was
performed. No intravenous contrast was administered.

[Series 3: T2 · sagittal · 4.0mm · 0.88mm/px · 6 of 15 slices shown (1 of 2)]
[im 1/15]
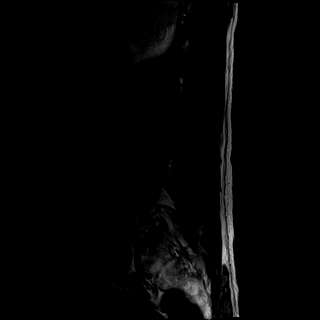
[im 3/15]
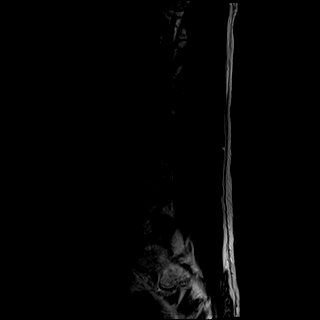
[im 6/15]
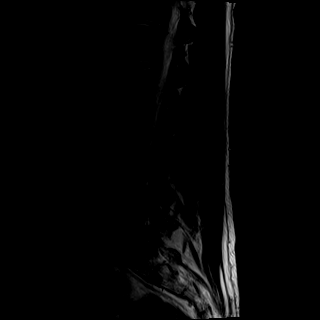
[im 9/15]
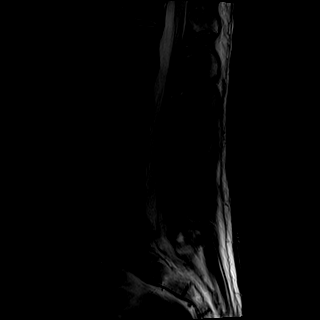
[im 12/15]
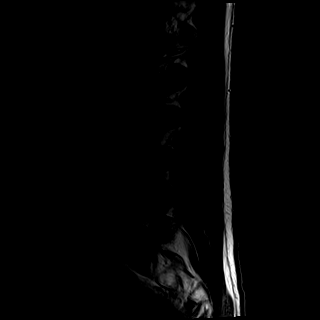
[im 15/15]
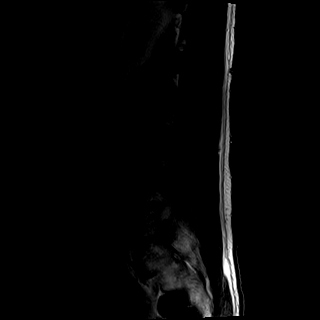

[Series 4: T1 · sagittal · 4.0mm · 0.81mm/px · 6 of 15 slices shown (1 of 2)]
[im 1/15]
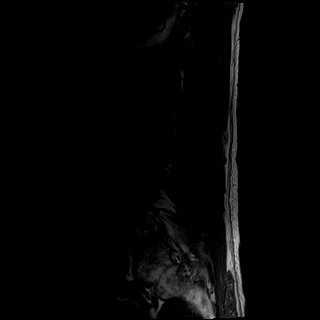
[im 3/15]
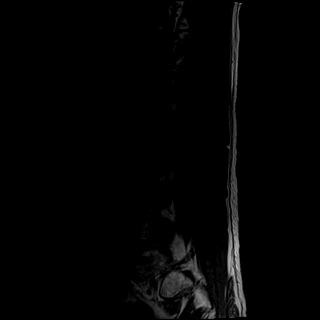
[im 6/15]
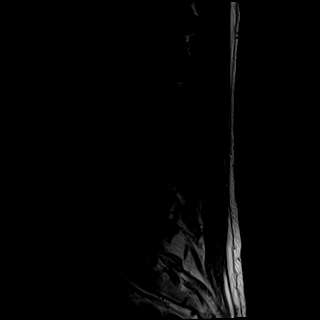
[im 9/15]
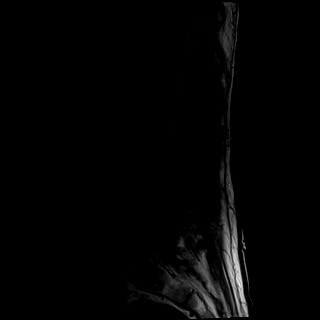
[im 12/15]
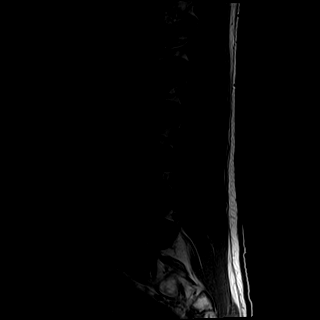
[im 15/15]
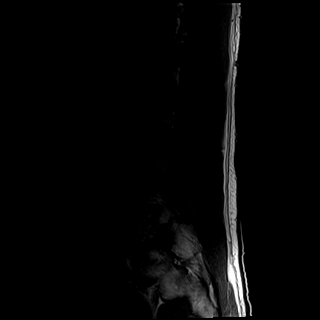

[Series 5: STIR · sagittal · 4.0mm · 0.81mm/px · 4 of 15 slices shown]
[im 1/15]
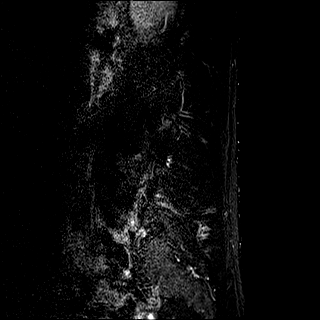
[im 3/15]
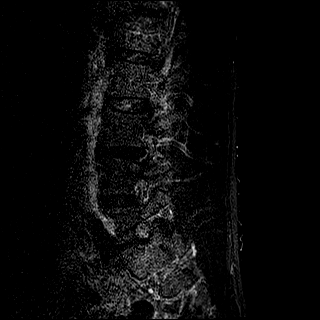
[im 6/15]
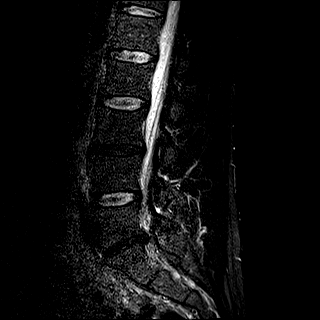
[im 9/15]
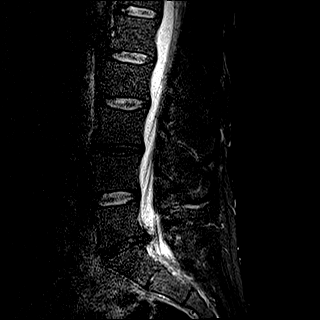

[Series 6: T2 · axial · 4.0mm · 0.78mm/px · z∈[-131,+96]mm · 9 of 40 slices shown (2 of 2)]
[im 1/40]
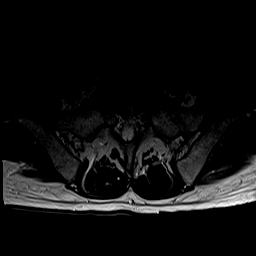
[im 6/40]
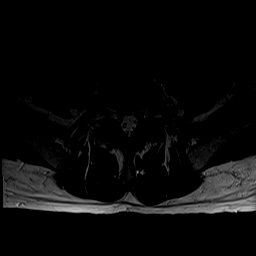
[im 12/40]
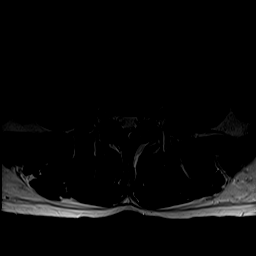
[im 17/40]
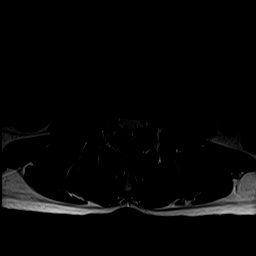
[im 20/40]
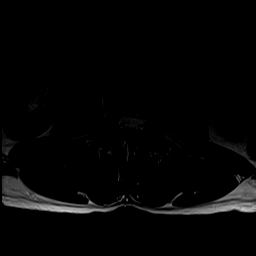
[im 23/40]
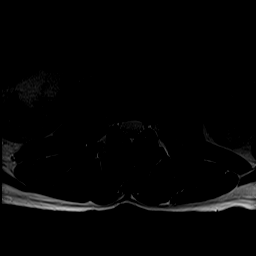
[im 28/40]
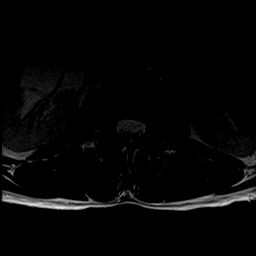
[im 34/40]
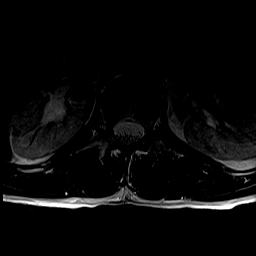
[im 40/40]
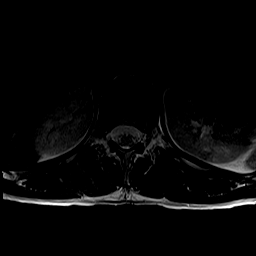

[Series 7: T1 · axial · 4.0mm · 0.39mm/px · z∈[-131,+96]mm · 9 of 40 slices shown (2 of 2)]
[im 1/40]
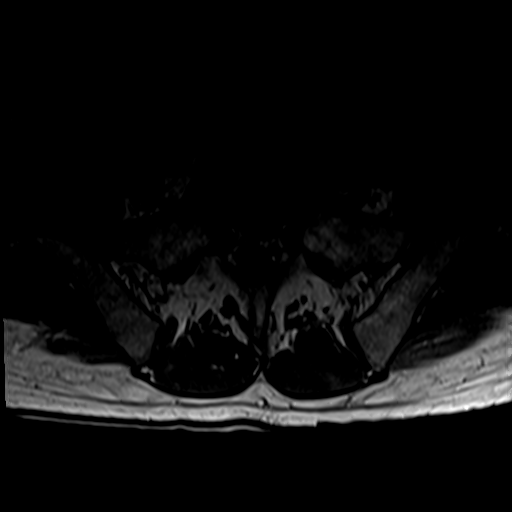
[im 6/40]
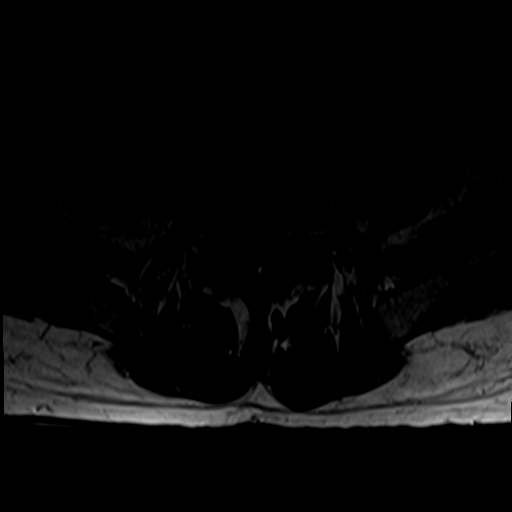
[im 12/40]
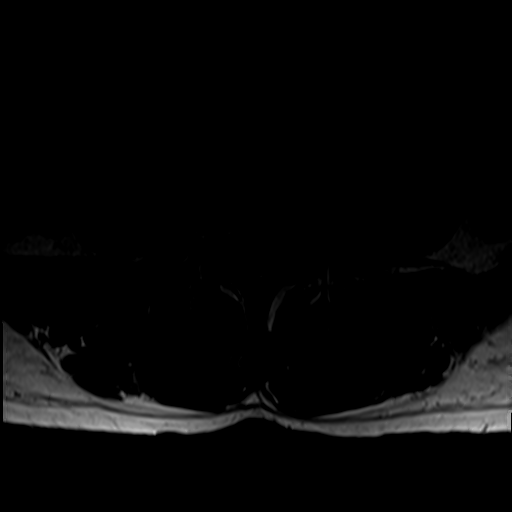
[im 17/40]
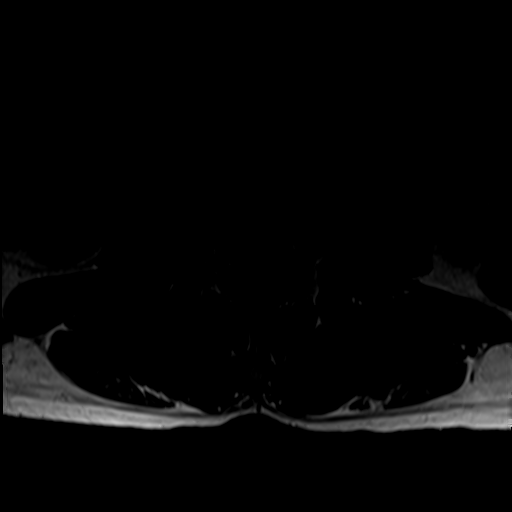
[im 20/40]
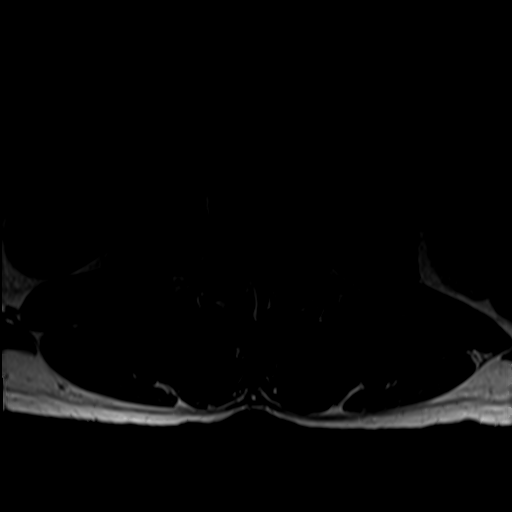
[im 23/40]
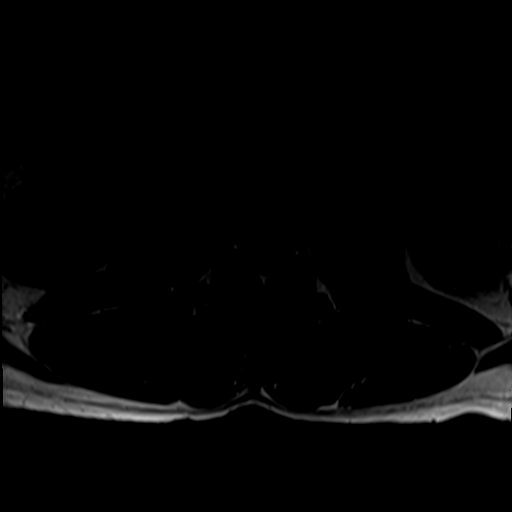
[im 28/40]
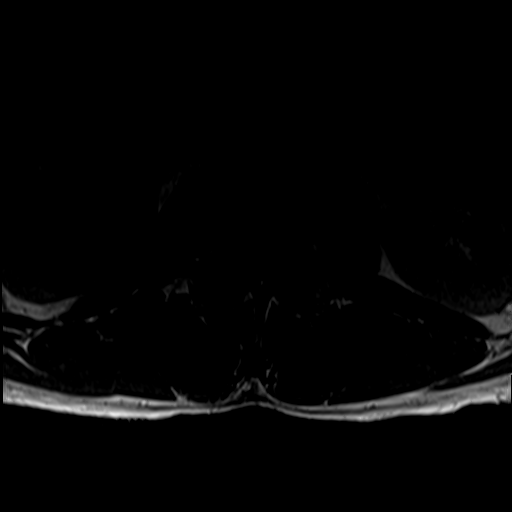
[im 34/40]
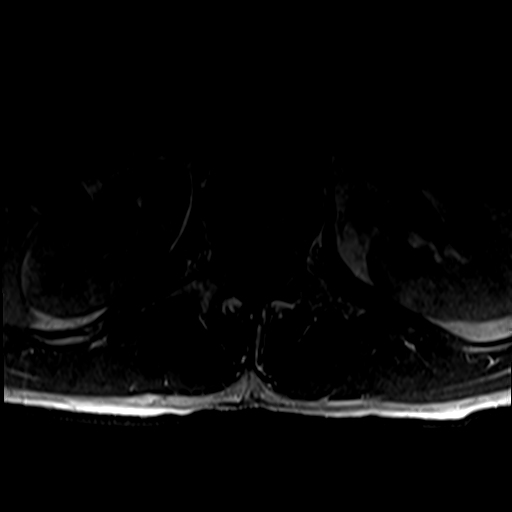
[im 40/40]
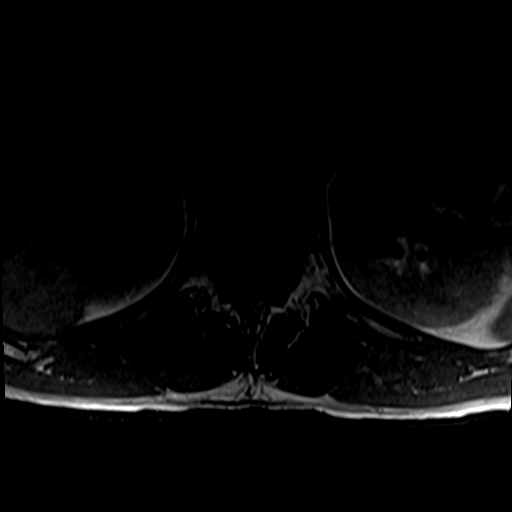

[34 of 48 positions shown; findings below may reference images not displayed]

FINDINGS: Segmentation: Normal segmentation. Lowest well-formed disc labeled
the L5-S1 level.

Alignment: Trace 3 mm retrolisthesis of L5 on S1. Vertebral bodies
otherwise normally aligned with preservation of the normal lumbar
lordosis.

Vertebrae: Vertebral body heights maintained without evidence for
acute or chronic fracture. Bone marrow signal intensity within
normal limits. No discrete or worrisome osseous lesions. No abnormal
marrow edema.

Conus medullaris and cauda equina: Conus extends to the L1 level.
Conus and cauda equina appear normal.

Paraspinal and other soft tissues: Paraspinous soft tissues within
normal limits. Prominent parapelvic cysts noted within the right
kidney. Visceral structures otherwise unremarkable.

Disc levels:

L1-2:  Unremarkable.

L2-3:  Unremarkable.

L3-4: Diffuse disc bulge with disc desiccation. There is a
superimposed left foraminal/extraforaminal disc protrusion,
contacting and displacing the exiting left L3 nerve root (series 6,
image 26). No significant canal stenosis. Mild right with mild to
moderate left L3 foraminal narrowing.

L4-5: Normal interspace. Mild to moderate facet hypertrophy. No
significant canal or foraminal stenosis.

L5-S1: Chronic intervertebral disc space narrowing with diffuse disc
bulge and disc desiccation. Superimposed right subarticular disc
protrusion extends into the right lateral recess, impinging upon the
descending right S1 nerve root (series 6, image 37). Mild facet and
ligament flavum hypertrophy. Resultant moderate to severe right with
moderate left lateral recess narrowing. Foramina remain patent.
IMPRESSION: 1. Left foraminal/extraforaminal disc protrusion at L3-4, contacting
and displacing the exiting left L3 nerve root.
2. Shallow right subarticular disc protrusion at L5-S1, impinging
upon the descending right S1 nerve root.
